# Patient Record
Sex: Female | Born: 1985 | Race: White | Hispanic: No | Marital: Married | Smoking: Never smoker
Health system: Southern US, Community
[De-identification: ages and names within clinical notes are randomized; demographics above are authoritative.]

---

## 2011-10-30 ENCOUNTER — Emergency Department (HOSPITAL_COMMUNITY)
Admission: EM | Admit: 2011-10-30 | Discharge: 2011-10-31 | Disposition: A | Discharge status: Home / Self Care | Payer: Self-pay | Attending: Emergency Medicine | Admitting: Emergency Medicine

## 2011-10-30 ENCOUNTER — Encounter (HOSPITAL_COMMUNITY): Payer: Self-pay | Admitting: Emergency Medicine

## 2011-10-30 DIAGNOSIS — N390 Urinary tract infection, site not specified: Secondary | ICD-10-CM | POA: Insufficient documentation

## 2011-10-30 LAB — POCT PREGNANCY, URINE: Preg Test, Ur: NEGATIVE

## 2011-10-30 LAB — URINALYSIS, ROUTINE W REFLEX MICROSCOPIC
Bilirubin Urine: NEGATIVE
Nitrite: NEGATIVE
Specific Gravity, Urine: 1.015 (ref 1.005–1.030)
pH: 7 (ref 5.0–8.0)

## 2011-10-30 LAB — URINE MICROSCOPIC-ADD ON

## 2011-10-30 NOTE — ED Notes (Signed)
PT. REPORTS URINARY FREQUENCY / BLADDER PRESSURE FOR SEVERAL DAYS .

## 2011-10-31 MED ORDER — PHENAZOPYRIDINE HCL 100 MG PO TABS
200.0000 mg | ORAL_TABLET | Freq: Once | ORAL | Status: AC
  Administered 2011-10-31: 200 mg via ORAL
  Filled 2011-10-31 (×2): qty 1

## 2011-10-31 MED ORDER — SULFAMETHOXAZOLE-TMP DS 800-160 MG PO TABS
1.0000 | ORAL_TABLET | Freq: Once | ORAL | Status: AC
  Administered 2011-10-31: 1 via ORAL
  Filled 2011-10-31: qty 1

## 2011-10-31 MED ORDER — SULFAMETHOXAZOLE-TMP DS 800-160 MG PO TABS: 1.0000 | ORAL_TABLET | Freq: Two times a day (BID) | ORAL | Status: AC

## 2011-10-31 NOTE — ED Provider Notes (Signed)
Medical screening examination/treatment/procedure(s) were performed by non-physician practitioner and as supervising physician I was immediately available for consultation/collaboration.  Yalda Herd, MD 10/31/11 0128 

## 2011-10-31 NOTE — ED Provider Notes (Signed)
History     CSN: [id number]  Arrival date & time 10/30/11  2250   First MD Initiated Contact with Patient 10/31/11 0016      Chief Complaint  Patient presents with  . Urinary Tract Infection    (Consider location/radiation/quality/duration/timing/severity/associated sxs/prior treatment) HPI Comments: Patient states she's had urinary frequency, and bladder pressure.  The past 2-3, days.  Denies any hematuria, fever, back pain, vaginal discharge  She states she has been frequent UTIs, but this is typical discomfort for her.    Patient is a 26 y.o. female presenting with urinary tract infection. The history is provided by the patient.  Urinary Tract Infection This is a new problem. The current episode started yesterday. The problem has been unchanged. Associated symptoms include urinary symptoms. Pertinent negatives include no abdominal pain, chills, fever, headaches, nausea, vomiting or weakness.    History reviewed. No pertinent past medical history.  History reviewed. No pertinent past surgical history.  No family history on file.  History  Substance Use Topics  . Smoking status: Never Smoker   . Smokeless tobacco: Not on file  . Alcohol Use: Yes    OB History    Grav Para Term Preterm Abortions TAB SAB Ect Mult Living                  Review of Systems  Constitutional: Negative for fever and chills.  Gastrointestinal: Negative for nausea, vomiting, abdominal pain and diarrhea.  Genitourinary: Positive for dysuria and frequency. Negative for hematuria and vaginal discharge.  Neurological: Negative for dizziness, weakness and headaches.    Allergies  Penicillins  Home Medications   Current Outpatient Rx  Name Route Sig Dispense Refill  . OVER THE COUNTER MEDICATION Oral Take 1 tablet by mouth daily. Over the Counter Diet Pills    . SULFAMETHOXAZOLE-TMP DS 800-160 MG PO TABS Oral Take 1 tablet by mouth 2 (two) times daily. 5 tablet 0    BP 110/60  Pulse 88   Temp 97.8 F (36.6 C) (Oral)  Resp 14  SpO2 100%  LMP 10/19/2011  Physical Exam  Constitutional: She appears well-developed and well-nourished.  HENT:  Head: Normocephalic.  Eyes: Pupils are equal, round, and reactive to light.  Neck: Normal range of motion.  Cardiovascular: Normal rate.   Pulmonary/Chest: Effort normal.  Abdominal: Soft. Bowel sounds are normal. She exhibits no distension. There is no tenderness.  Genitourinary: Vagina normal.  Musculoskeletal: Normal range of motion.  Neurological: She is alert.  Skin: Skin is warm.    ED Course  Procedures (including critical care time)  Labs Reviewed  URINALYSIS, ROUTINE W REFLEX MICROSCOPIC - Abnormal; Notable for the following:    APPearance CLOUDY (*)     Hgb urine dipstick MODERATE (*)     Ketones, ur >80 (*)     Leukocytes, UA MODERATE (*)     All other components within normal limits  URINE MICROSCOPIC-ADD ON - Abnormal; Notable for the following:    Squamous Epithelial / LPF FEW (*)     All other components within normal limits  POCT PREGNANCY, URINE   No results found.   1. UTI (lower urinary tract infection)       MDM   Urine results reviewed.  Discussed with patient treatment options.  She is requesting an inexpensive antibiotic.  Due to financial concerns       Arman Filter, NP 10/31/11 0111  Arman Filter, NP 10/31/11 0111

## 2011-10-31 NOTE — ED Notes (Signed)
Pt c/o bladder pressure, urinary frequency and urgency for two days.  Today states he  urine was pink.  NOt on menstrualscycle.  LMP 7/26.  Denies actual pain at this time.  No  History of same

## 2013-03-01 ENCOUNTER — Emergency Department (HOSPITAL_COMMUNITY)
Admission: EM | Admit: 2013-03-01 | Discharge: 2013-03-01 | Disposition: A | Discharge status: Home / Self Care | Payer: Medicaid Other | Attending: Emergency Medicine | Admitting: Emergency Medicine

## 2013-03-01 ENCOUNTER — Encounter (HOSPITAL_COMMUNITY): Payer: Self-pay | Admitting: Emergency Medicine

## 2013-03-01 DIAGNOSIS — Z3202 Encounter for pregnancy test, result negative: Secondary | ICD-10-CM | POA: Insufficient documentation

## 2013-03-01 DIAGNOSIS — R110 Nausea: Secondary | ICD-10-CM | POA: Insufficient documentation

## 2013-03-01 DIAGNOSIS — Z880 Allergy status to penicillin: Secondary | ICD-10-CM | POA: Insufficient documentation

## 2013-03-01 DIAGNOSIS — R197 Diarrhea, unspecified: Secondary | ICD-10-CM | POA: Insufficient documentation

## 2013-03-01 DIAGNOSIS — R109 Unspecified abdominal pain: Secondary | ICD-10-CM | POA: Insufficient documentation

## 2013-03-01 DIAGNOSIS — R5381 Other malaise: Secondary | ICD-10-CM | POA: Insufficient documentation

## 2013-03-01 LAB — URINALYSIS, ROUTINE W REFLEX MICROSCOPIC
Bilirubin Urine: NEGATIVE
Glucose, UA: NEGATIVE mg/dL
Ketones, ur: NEGATIVE mg/dL
Leukocytes, UA: NEGATIVE
Protein, ur: NEGATIVE mg/dL

## 2013-03-01 MED ORDER — ONDANSETRON HCL 4 MG/2ML IJ SOLN
4.0000 mg | Freq: Once | INTRAMUSCULAR | Status: AC
  Administered 2013-03-01: 4 mg via INTRAVENOUS
  Filled 2013-03-01: qty 2

## 2013-03-01 MED ORDER — PROMETHAZINE HCL 25 MG PO TABS: 25.0000 mg | ORAL_TABLET | Freq: Four times a day (QID) | ORAL | Status: DC | PRN

## 2013-03-01 MED ORDER — SODIUM CHLORIDE 0.9 % IV BOLUS (SEPSIS)
1000.0000 mL | Freq: Once | INTRAVENOUS | Status: AC
  Administered 2013-03-01: 1000 mL via INTRAVENOUS

## 2013-03-01 NOTE — ED Provider Notes (Signed)
CSN: [id number]     Arrival date & time 03/01/13  1739 History   First MD Initiated Contact with Patient 03/01/13 1803     Chief Complaint  Patient presents with  . Diarrhea   (Consider location/radiation/quality/duration/timing/severity/associated sxs/prior Treatment) Patient is a 27 y.o. female presenting with diarrhea. The history is provided by the patient.  Diarrhea Associated symptoms: abdominal pain   Associated symptoms: no headaches and no vomiting    patient's had diarrhea and nausea since Thursday. Her daughter had similar symptoms. She states she's had some mild chills. She's had abdominal cramping. No vomiting. She feels somewhat fatigued. No blood in stool. She did have black stool, but states she thinks it was from her Pepto-Bismol. She stopped taking that and the stool change color.  History reviewed. No pertinent past medical history. History reviewed. No pertinent past surgical history. History reviewed. No pertinent family history. History  Substance Use Topics  . Smoking status: Never Smoker   . Smokeless tobacco: Not on file  . Alcohol Use: Yes   OB History   Grav Para Term Preterm Abortions TAB SAB Ect Mult Living                 Review of Systems  Constitutional: Positive for appetite change and fatigue. Negative for activity change.  Eyes: Negative for pain.  Respiratory: Negative for chest tightness and shortness of breath.   Cardiovascular: Negative for chest pain and leg swelling.  Gastrointestinal: Positive for nausea, abdominal pain and diarrhea. Negative for vomiting.  Genitourinary: Negative for flank pain.  Musculoskeletal: Negative for back pain and neck stiffness.  Skin: Negative for rash.  Neurological: Negative for weakness, numbness and headaches.  Psychiatric/Behavioral: Negative for behavioral problems.    Allergies  Penicillins  Home Medications   Current Outpatient Rx  Name  Route  Sig  Dispense  Refill  . promethazine  (PHENERGAN) 25 MG tablet   Oral   Take 1 tablet (25 mg total) by mouth every 6 (six) hours as needed for nausea.   10 tablet   0    BP 106/70  Pulse 84  Temp(Src) 98 F (36.7 C) (Oral)  Resp 20  SpO2 100%  LMP 01/28/2013 Physical Exam  Nursing note and vitals reviewed. Constitutional: She is oriented to person, place, and time. She appears well-developed and well-nourished.  HENT:  Head: Normocephalic and atraumatic.  Eyes: EOM are normal. Pupils are equal, round, and reactive to light.  Neck: Normal range of motion. Neck supple.  Cardiovascular: Normal rate, regular rhythm and normal heart sounds.   No murmur heard. Pulmonary/Chest: Effort normal and breath sounds normal. No respiratory distress. She has no wheezes. She has no rales.  Abdominal: Soft. Bowel sounds are normal. She exhibits no distension. There is no tenderness. There is no rebound and no guarding.  Musculoskeletal: Normal range of motion.  Neurological: She is alert and oriented to person, place, and time. No cranial nerve deficit.  Skin: Skin is warm and dry.  Psychiatric: She has a normal mood and affect. Her speech is normal.    ED Course  Procedures (including critical care time) Labs Review Labs Reviewed  URINALYSIS, ROUTINE W REFLEX MICROSCOPIC  PREGNANCY, URINE   Imaging Review No results found.  EKG Interpretation   None       MDM   1. Diarrhea    Patient with nausea and diarrhea. Urine reassuring. Patient not pregnant. Patient feels better as tolerated orals after IV fluid. She be discharged  home    Juliet Rude. Rubin Payor, MD 03/01/13 2001

## 2013-03-01 NOTE — ED Notes (Signed)
Pt reports having diarrhea since Thursday, nausea and abd pain. Denies fever/chills. No acute distress noted at triage.

## 2013-03-26 NOTE — L&D Delivery Note (Signed)
[facility]  Labor Events:   Preterm labor:   Rupture date:   Rupture time:   Rupture type: Spontaneous  Fluid Color: Clear  Induction:   Augmentation:   Complications:   Cervical ripening:          Delivery:   Episiotomy: No  Lacerations: Second degree laceration   Repair suture: 3.0 vicryl, monocryl  Repair # of packets: 2  Blood loss (ml): 200   Information for the patient's newborn:  Dorna BloomFuquay, Boy Kris [[id number]][[id number]]    Delivery 03/10/2014 4:50 PM by  VBAC, Spontaneous Sex:  female Gestational Age: 9128w2d Delivery Clinician:  Benjamin StainMckenzie L Shakesha Soltau Living?: Yes        APGARS  One minute Five minutes Ten minutes  Skin color: 1   1      Heart rate: 2   2      Grimace: 2   2      Muscle tone: 2   2      Breathing: 2   2      Totals: 9  9      Presentation/position: Vertex  Right Occiput Anterior Resuscitation: None  Cord information:    Disposition of cord blood:     Blood gases sent?  Complications:   Placenta: Delivered: 03/10/2014 4:59 PM  Spontaneous  Intact appearance Newborn Measurements: Weight:    Height:    Head circumference:    Chest circumference:    Other providers: Delivery Nurse Attending Physician Halford DecampLindsay Stowe [facility] Kristy Rocio Acosta  Additional  information: Forceps:   Vacuum:   Breech:   Observed anomalies

## 2013-07-08 ENCOUNTER — Encounter (HOSPITAL_COMMUNITY): Payer: Self-pay | Admitting: Emergency Medicine

## 2013-07-08 ENCOUNTER — Emergency Department (HOSPITAL_COMMUNITY)
Admission: EM | Admit: 2013-07-08 | Discharge: 2013-07-08 | Disposition: A | Discharge status: Home / Self Care | Payer: Medicaid Other | Attending: Emergency Medicine | Admitting: Emergency Medicine

## 2013-07-08 DIAGNOSIS — O219 Vomiting of pregnancy, unspecified: Secondary | ICD-10-CM | POA: Diagnosis present

## 2013-07-08 DIAGNOSIS — R110 Nausea: Secondary | ICD-10-CM | POA: Diagnosis not present

## 2013-07-08 DIAGNOSIS — R112 Nausea with vomiting, unspecified: Secondary | ICD-10-CM

## 2013-07-08 DIAGNOSIS — O9989 Other specified diseases and conditions complicating pregnancy, childbirth and the puerperium: Secondary | ICD-10-CM | POA: Insufficient documentation

## 2013-07-08 DIAGNOSIS — Z3490 Encounter for supervision of normal pregnancy, unspecified, unspecified trimester: Secondary | ICD-10-CM

## 2013-07-08 DIAGNOSIS — R638 Other symptoms and signs concerning food and fluid intake: Secondary | ICD-10-CM | POA: Insufficient documentation

## 2013-07-08 DIAGNOSIS — Z880 Allergy status to penicillin: Secondary | ICD-10-CM | POA: Insufficient documentation

## 2013-07-08 LAB — URINALYSIS, ROUTINE W REFLEX MICROSCOPIC
BILIRUBIN URINE: NEGATIVE
Glucose, UA: NEGATIVE mg/dL
Hgb urine dipstick: NEGATIVE
KETONES UR: NEGATIVE mg/dL
Leukocytes, UA: NEGATIVE
NITRITE: NEGATIVE
Protein, ur: NEGATIVE mg/dL
SPECIFIC GRAVITY, URINE: 1.015 (ref 1.005–1.030)
UROBILINOGEN UA: 1 mg/dL (ref 0.0–1.0)
pH: 7.5 (ref 5.0–8.0)

## 2013-07-08 LAB — POC URINE PREG, ED: PREG TEST UR: POSITIVE — AB

## 2013-07-08 MED ORDER — ONDANSETRON 4 MG PO TBDP: ORAL_TABLET | ORAL | Status: DC

## 2013-07-08 NOTE — Progress Notes (Signed)
[facility] provided pt with a list of primary care resources and a [facility] [employer] application to help patient establish primary care.  °

## 2013-07-08 NOTE — Discharge Instructions (Signed)
If you were given medicines take as directed.  If you are on coumadin or contraceptives realize their levels and effectiveness is altered by many different medicines.  If you have any reaction (rash, tongues swelling, other) to the medicines stop taking and see a physician.   Please follow up as directed and return to the ER or see a physician for new or worsening symptoms.  Thank you. Filed Vitals:   07/08/13 0947  BP: 111/77  Pulse: 77  Temp: 97 F (36.1 C)  TempSrc: Axillary  Resp: 18  SpO2: 100%

## 2013-07-08 NOTE — ED Notes (Signed)
Pt c/o NV x 2 days.  Denies pain.  LMP 06/01/13.

## 2013-07-08 NOTE — ED Provider Notes (Signed)
CSN: [id number]     Arrival date & time 07/08/13  [id number] History   First MD Initiated Contact with Patient 07/08/13 [phone]     Chief Complaint  Patient presents with  . Nausea  . Emesis     (Consider location/radiation/quality/duration/timing/severity/associated sxs/prior Treatment) HPI Comments: 28 yo female with pregnancy/ vomiting with pregnancy hx presents with nausea for the past three days.  No abd pain or diarrhea, pt feels well otherwise. Last menstrual period late. Pt is sexually active, no bleeding or discharge.  Intermittent sxs.   Patient is a 28 y.o. female presenting with vomiting. The history is provided by the patient.  Emesis Associated symptoms: no abdominal pain, no chills, no diarrhea and no headaches     History reviewed. No pertinent past medical history. No past surgical history on file. No family history on file. History  Substance Use Topics  . Smoking status: Never Smoker   . Smokeless tobacco: Not on file  . Alcohol Use: Yes   OB History   Grav Para Term Preterm Abortions TAB SAB Ect Mult Living                 Review of Systems  Constitutional: Positive for appetite change. Negative for fever and chills.  HENT: Negative for congestion.   Eyes: Negative for visual disturbance.  Respiratory: Negative for shortness of breath.   Cardiovascular: Negative for chest pain.  Gastrointestinal: Positive for nausea and vomiting. Negative for abdominal pain and diarrhea.  Genitourinary: Negative for dysuria and flank pain.  Musculoskeletal: Negative for back pain, neck pain and neck stiffness.  Skin: Negative for rash.  Neurological: Negative for light-headedness and headaches.      Allergies  Penicillins  Home Medications   Prior to Admission medications   Medication Sig Start Date End Date Taking? Authorizing Provider  cetirizine (ZYRTEC) 10 MG tablet Take 10 mg by mouth daily as needed for allergies.   Yes Historical Provider, MD  ondansetron  (ZOFRAN ODT) 4 MG disintegrating tablet 4mg  ODT q4 hours prn nausea/vomit 07/08/13   Enid SkeensJoshua M Chiana Wamser, MD   BP 111/77  Pulse 77  Temp(Src) 97 F (36.1 C) (Axillary)  Resp 18  SpO2 100%  LMP 06/01/2013 Physical Exam  Nursing note and vitals reviewed. Constitutional: She is oriented to person, place, and time. She appears well-developed and well-nourished.  HENT:  Head: Normocephalic and atraumatic.  Eyes: Conjunctivae are normal. Right eye exhibits no discharge. Left eye exhibits no discharge.  Neck: Normal range of motion. Neck supple. No tracheal deviation present.  Cardiovascular: Normal rate and regular rhythm.   Pulmonary/Chest: Effort normal and breath sounds normal.  Abdominal: Soft. She exhibits no distension. There is no tenderness. There is no guarding.  Musculoskeletal: She exhibits no edema.  Neurological: She is alert and oriented to person, place, and time.  Skin: Skin is warm. No rash noted.  Psychiatric: She has a normal mood and affect.    ED Course  Procedures (including critical care time) EMERGENCY DEPARTMENT US PREGNANCY "Study: Limited Ultrasound of the Pelvis for Pregnancy"  INDICATIONS:Pregnancy(required) nausea Multiple views of the uterus and pelvic cavity were obtained in real-time with a multi-frequency probe.  APPROACH:Transabdominal   PERFORMED BY: Myself  IMAGES ARCHIVED?: Yes  LIMITATIONS: Body habitus  PREGNANCY FREE FLUID: None  PREGNANCY FINDINGS: Intrauterine gestational sac noted  INTERPRETATION: gest sac shows [redacted] wk gestation, no FH tones seen; likely early preg, cannot rule out abnormal preg      Labs Review Labs  Reviewed  POC URINE PREG, ED - Abnormal; Notable for the following:    Preg Test, Ur POSITIVE (*)    All other components within normal limits  URINALYSIS, ROUTINE W REFLEX MICROSCOPIC    Imaging Review No results found.   EKG Interpretation None      MDM   Final diagnoses:  Pregnancy  Nausea & vomiting     Well appearing. Nausea main sxs. New pregnancy, no pain or bleeding, no hx of ectopic. Bedside US gest sac intrauterine, no free fluid, unable to see FHR either due to too early or needs transvaginal. No indication for emergent transvaginal US, fup with OB discussed. Results and differential diagnosis were discussed with the patient. Close follow up outpatient was discussed, patient comfortable with the plan.   Filed Vitals:   07/08/13 0947  BP: 111/77  Pulse: 77  Temp: 97 F (36.1 C)  TempSrc: Axillary  Resp: 18  SpO2: 100%           Enid SkeensJoshua M Syann Cupples, MD 07/08/13 1038

## 2013-07-14 ENCOUNTER — Encounter: Payer: Self-pay | Admitting: Advanced Practice Midwife

## 2013-07-29 ENCOUNTER — Encounter: Payer: Self-pay | Admitting: Advanced Practice Midwife

## 2013-08-05 ENCOUNTER — Encounter: Payer: Self-pay | Admitting: Advanced Practice Midwife

## 2013-08-05 ENCOUNTER — Encounter: Payer: Self-pay | Admitting: *Deleted

## 2013-08-05 ENCOUNTER — Ambulatory Visit (INDEPENDENT_AMBULATORY_CARE_PROVIDER_SITE_OTHER): Payer: Self-pay | Admitting: Advanced Practice Midwife

## 2013-08-05 VITALS — BP 108/72 | HR 94 | Temp 97.2°F | Ht 64.0 in | Wt 189.5 lb

## 2013-08-05 DIAGNOSIS — O34219 Maternal care for unspecified type scar from previous cesarean delivery: Secondary | ICD-10-CM | POA: Insufficient documentation

## 2013-08-05 LAB — POCT URINALYSIS DIP (DEVICE)
Bilirubin Urine: NEGATIVE
Glucose, UA: NEGATIVE mg/dL
Hgb urine dipstick: NEGATIVE
Ketones, ur: NEGATIVE mg/dL
LEUKOCYTES UA: NEGATIVE
NITRITE: NEGATIVE
PH: 7.5 (ref 5.0–8.0)
PROTEIN: NEGATIVE mg/dL
Specific Gravity, Urine: 1.015 (ref 1.005–1.030)
Urobilinogen, UA: 1 mg/dL (ref 0.0–1.0)

## 2013-08-05 NOTE — Progress Notes (Signed)
C/o of left hip pain.  New OB labs today. Early 1hr gtt. Pap with cultures.  Discussed appropriate weight gain (11-20lb); pt. Verbalized understanding.  New OB packet given.

## 2013-08-05 NOTE — Progress Notes (Signed)
New OB.  See SmartSet  Subjective:    Doris Mays is a G2P1001 5683w2d being seen today for her first obstetrical visit.  Her obstetrical history is significant for obesity and previous C/S. Patient does intend to breast feed. Pregnancy history fully reviewed.  Patient reports no complaints.  Filed Vitals:   08/05/13 1106 08/05/13 1110  BP: 108/72   Pulse: 94   Temp: 97.2 F (36.2 C)   Height:  5\' 4"  (1.626 m)  Weight: 189 lb 8 oz (85.957 kg)     HISTORY: OB History  Gravida Para Term Preterm AB SAB TAB Ectopic Multiple Living  2 1 1  0 0 0 0 0 0 1    # Outcome Date GA Lbr Len/2nd Weight Sex Delivery Anes PTL Lv  2 CUR           1 TRM 08/01/05 2991w0d   F CS   Y     Comments: baby breech      History reviewed. No pertinent past medical history. Past Surgical History  Procedure Laterality Date  . Cesarean section     History reviewed. No pertinent family history.   Exam    Uterus:  Fundal Height: 9 cm  Pelvic Exam:    Perineum: No Hemorrhoids, Normal Perineum   Vulva: Bartholin's, Urethra, Skene's normal   Vagina:  normal discharge   pH:    Cervix: no cervical motion tenderness   Adnexa: no mass, fullness, tenderness   Bony Pelvis: gynecoid  System: Breast:  normal appearance, no masses or tenderness   Skin: normal coloration and turgor, no rashes    Neurologic: oriented, grossly non-focal   Extremities: normal strength, tone, and muscle mass   HEENT neck supple with midline trachea   Mouth/Teeth mucous membranes moist, pharynx normal without lesions   Neck supple and no masses   Cardiovascular: regular rate and rhythm, no murmurs or gallops   Respiratory:  appears well, vitals normal, no respiratory distress, acyanotic, normal RR, ear and throat exam is normal, neck Mays of mass or lymphadenopathy, chest clear, no wheezing, crepitations, rhonchi, normal symmetric air entry   Abdomen: soft, non-tender; bowel sounds normal; no masses,  no organomegaly   Urinary:  urethral meatus normal   Unable to doppler FHTs   Assessment:    Pregnancy: G2P1001 Patient Active Problem List   Diagnosis Date Noted  . Previous cesarean delivery affecting pregnancy, antepartum 08/05/2013        Plan:     Initial labs drawn. Prenatal vitamins. Problem list reviewed and updated. Genetic Screening discussed First Screen: declined.  Ultrasound discussed; fetal survey: Ordered for dating and viability.  Follow up in 4 weeks. 50% of 30 min visit spent on counseling and coordination of care.     Aviva SignsMarie L Williams 08/05/2013

## 2013-08-05 NOTE — Patient Instructions (Signed)
Pregnancy - First Trimester  During sexual intercourse, millions of sperm go into the vagina. Only 1 sperm will penetrate and fertilize the female egg while it is in the Fallopian tube. One week later, the fertilized egg implants into the wall of the uterus. An embryo begins to develop into a baby. At 6 to 8 weeks, the eyes and face are formed and the heartbeat can be seen on ultrasound. At the end of 12 weeks (first trimester), all the baby's organs are formed. Now that you are pregnant, you will want to do everything you can to have a healthy baby. Two of the most important things are to get good prenatal care and follow your caregiver's instructions. Prenatal care is all the medical care you receive before the baby's birth. It is given to prevent, find, and treat problems during the pregnancy and childbirth.  PRENATAL EXAMS  · During prenatal visits, your weight, blood pressure, and urine are checked. This is done to make sure you are healthy and progressing normally during the pregnancy.  · A pregnant woman should gain 25 to 35 pounds during the pregnancy. However, if you are overweight or underweight, your caregiver will advise you regarding your weight.  · Your caregiver will ask and answer questions for you.  · Blood work, cervical cultures, other necessary tests, and a Pap test are done during your prenatal exams. These tests are done to check on your health and the probable health of your baby. Tests are strongly recommended and done for HIV with your permission. This is the virus that causes AIDS. These tests are done because medicines can be given to help prevent your baby from being born with this infection should you have been infected without knowing it. Blood work is also used to find out your blood type, previous infections, and follow your blood levels (hemoglobin).  · Low hemoglobin (anemia) is common during pregnancy. Iron and vitamins are given to help prevent this. Later in the pregnancy, blood  tests for diabetes will be done along with any other tests if any problems develop.  · You may need other tests to make sure you and the baby are doing well.  CHANGES DURING THE FIRST TRIMESTER   Your body goes through many changes during pregnancy. They vary from person to person. Talk to your caregiver about changes you notice and are concerned about. Changes can include:  · Your menstrual period stops.  · The egg and sperm carry the genes that determine what you look like. Genes from you and your partner are forming a baby. The female genes determine whether the baby is a boy or a girl.  · Your body increases in girth and you may feel bloated.  · Feeling sick to your stomach (nauseous) and throwing up (vomiting). If the vomiting is uncontrollable, call your caregiver.  · Your breasts will begin to enlarge and become tender.  · Your nipples may stick out more and become darker.  · The need to urinate more. Painful urination may mean you have a bladder infection.  · Tiring easily.  · Loss of appetite.  · Cravings for certain kinds of food.  · At first, you may gain or lose a couple of pounds.  · You may have changes in your emotions from day to day (excited to be pregnant or concerned something may go wrong with the pregnancy and baby).  · You may have more vivid and strange dreams.  HOME CARE INSTRUCTIONS   ·   It is very important to avoid all smoking, alcohol and non-prescribed drugs during your pregnancy. These affect the formation and growth of the baby. Avoid chemicals while pregnant to ensure the delivery of a healthy infant.  · Start your prenatal visits by the 12th week of pregnancy. They are usually scheduled monthly at first, then more often in the last 2 months before delivery. Keep your caregiver's appointments. Follow your caregiver's instructions regarding medicine use, blood and lab tests, exercise, and diet.  · During pregnancy, you are providing food for you and your baby. Eat regular, well-balanced  meals. Choose foods such as meat, fish, milk and other low fat dairy products, vegetables, fruits, and whole-grain breads and cereals. Your caregiver will tell you of the ideal weight gain.  · You can help morning sickness by keeping soda crackers at the bedside. Eat a couple before arising in the morning. You may want to use the crackers without salt on them.  · Eating 4 to 5 small meals rather than 3 large meals a day also may help the nausea and vomiting.  · Drinking liquids between meals instead of during meals also seems to help nausea and vomiting.  · A physical sexual relationship may be continued throughout pregnancy if there are no other problems. Problems may be early (premature) leaking of amniotic fluid from the membranes, vaginal bleeding, or belly (abdominal) pain.  · Exercise regularly if there are no restrictions. Check with your caregiver or physical therapist if you are unsure of the safety of some of your exercises. Greater weight gain will occur in the last 2 trimesters of pregnancy. Exercising will help:  · Control your weight.  · Keep you in shape.  · Prepare you for labor and delivery.  · Help you lose your pregnancy weight after you deliver your baby.  · Wear a good support or jogging bra for breast tenderness during pregnancy. This may help if worn during sleep too.  · Ask when prenatal classes are available. Begin classes when they are offered.  · Do not use hot tubs, steam rooms, or saunas.  · Wear your seat belt when driving. This protects you and your baby if you are in an accident.  · Avoid raw meat, uncooked cheese, cat litter boxes, and soil used by cats throughout the pregnancy. These carry germs that can cause birth defects in the baby.  · The first trimester is a good time to visit your dentist for your dental health. Getting your teeth cleaned is okay. Use a softer toothbrush and brush gently during pregnancy.  · Ask for help if you have financial, counseling, or nutritional needs  during pregnancy. Your caregiver will be able to offer counseling for these needs as well as refer you for other special needs.  · Do not take any medicines or herbs unless told by your caregiver.  · Inform your caregiver if there is any mental or physical domestic violence.  · Make a list of emergency phone numbers of family, friends, hospital, and police and fire departments.  · Write down your questions. Take them to your prenatal visit.  · Do not douche.  · Do not cross your legs.  · If you have to stand for long periods of time, rotate you feet or take small steps in a circle.  · You may have more vaginal secretions that may require a sanitary pad. Do not use tampons or scented sanitary pads.  MEDICINES AND DRUG USE IN PREGNANCY  ·   Take prenatal vitamins as directed. The vitamin should contain 1 milligram of folic acid. Keep all vitamins out of reach of children. Only a couple vitamins or tablets containing iron may be fatal to a baby or young child when ingested.  · Avoid use of all medicines, including herbs, over-the-counter medicines, not prescribed or suggested by your caregiver. Only take over-the-counter or prescription medicines for pain, discomfort, or fever as directed by your caregiver. Do not use aspirin, ibuprofen, or naproxen unless directed by your caregiver.  · Let your caregiver also know about herbs you may be using.  · Alcohol is related to a number of birth defects. This includes fetal alcohol syndrome. All alcohol, in any form, should be avoided completely. Smoking will cause low birth rate and premature babies.  · Street or illegal drugs are very harmful to the baby. They are absolutely forbidden. A baby born to an addicted mother will be addicted at birth. The baby will go through the same withdrawal an adult does.  · Let your caregiver know about any medicines that you have to take and for what reason you take them.  SEEK MEDICAL CARE IF:   You have any concerns or worries during your  pregnancy. It is better to call with your questions if you feel they cannot wait, rather than worry about them.  SEEK IMMEDIATE MEDICAL CARE IF:   · An unexplained oral temperature above 102° F (38.9° C) develops, or as your caregiver suggests.  · You have leaking of fluid from the vagina (birth canal). If leaking membranes are suspected, take your temperature and inform your caregiver of this when you call.  · There is vaginal spotting or bleeding. Notify your caregiver of the amount and how many pads are used.  · You develop a bad smelling vaginal discharge with a change in the color.  · You continue to feel sick to your stomach (nauseated) and have no relief from remedies suggested. You vomit blood or coffee ground-like materials.  · You lose more than 2 pounds of weight in 1 week.  · You gain more than 2 pounds of weight in 1 week and you notice swelling of your face, hands, feet, or legs.  · You gain 5 pounds or more in 1 week (even if you do not have swelling of your hands, face, legs, or feet).  · You get exposed to German measles and have never had them.  · You are exposed to fifth disease or chickenpox.  · You develop belly (abdominal) pain. Round ligament discomfort is a common non-cancerous (benign) cause of abdominal pain in pregnancy. Your caregiver still must evaluate this.  · You develop headache, fever, diarrhea, pain with urination, or shortness of breath.  · You fall or are in a car accident or have any kind of trauma.  · There is mental or physical violence in your home.  Document Released: 03/06/2001 Document Revised: 12/05/2011 Document Reviewed: 09/07/2008  ExitCare® Patient Information ©2014 [employer].

## 2013-08-06 ENCOUNTER — Encounter: Payer: Self-pay | Admitting: Advanced Practice Midwife

## 2013-08-06 LAB — OBSTETRIC PANEL
ANTIBODY SCREEN: NEGATIVE
BASOS ABS: 0 10*3/uL (ref 0.0–0.1)
BASOS PCT: 0 % (ref 0–1)
Eosinophils Absolute: 0.1 10*3/uL (ref 0.0–0.7)
Eosinophils Relative: 1 % (ref 0–5)
HCT: 37.3 % (ref 36.0–46.0)
HEP B S AG: NEGATIVE
Hemoglobin: 12.6 g/dL (ref 12.0–15.0)
Lymphocytes Relative: 28 % (ref 12–46)
Lymphs Abs: 1.8 10*3/uL (ref 0.7–4.0)
MCH: 29.2 pg (ref 26.0–34.0)
MCHC: 33.8 g/dL (ref 30.0–36.0)
MCV: 86.3 fL (ref 78.0–100.0)
MONO ABS: 0.5 10*3/uL (ref 0.1–1.0)
Monocytes Relative: 8 % (ref 3–12)
NEUTROS ABS: 4.1 10*3/uL (ref 1.7–7.7)
NEUTROS PCT: 63 % (ref 43–77)
Platelets: 301 10*3/uL (ref 150–400)
RBC: 4.32 MIL/uL (ref 3.87–5.11)
RDW: 13.7 % (ref 11.5–15.5)
Rh Type: POSITIVE
Rubella: 1.54 Index — ABNORMAL HIGH (ref <0.90)
WBC: 6.5 10*3/uL (ref 4.0–10.5)

## 2013-08-06 LAB — GLUCOSE TOLERANCE, 1 HOUR (50G) W/O FASTING: Glucose, 1 Hour GTT: 98 mg/dL (ref 70–140)

## 2013-08-06 LAB — HIV ANTIBODY (ROUTINE TESTING W REFLEX): HIV: NONREACTIVE

## 2013-08-07 LAB — PRESCRIPTION MONITORING PROFILE (19 PANEL)
AMPHETAMINE/METH: NEGATIVE ng/mL
BUPRENORPHINE, URINE: NEGATIVE ng/mL
Barbiturate Screen, Urine: NEGATIVE ng/mL
Benzodiazepine Screen, Urine: NEGATIVE ng/mL
CARISOPRODOL, URINE: NEGATIVE ng/mL
Cannabinoid Scrn, Ur: NEGATIVE ng/mL
Cocaine Metabolites: NEGATIVE ng/mL
Creatinine, Urine: 74.83 mg/dL (ref >20.0)
Fentanyl, Ur: NEGATIVE ng/mL
MDMA URINE: NEGATIVE ng/mL
METHADONE SCREEN, URINE: NEGATIVE ng/mL
METHAQUALONE SCREEN (URINE): NEGATIVE ng/mL
Meperidine, Ur: NEGATIVE ng/mL
NITRITES URINE, INITIAL: NEGATIVE ug/mL
Opiate Screen, Urine: NEGATIVE ng/mL
Oxycodone Screen, Ur: NEGATIVE ng/mL
PROPOXYPHENE: NEGATIVE ng/mL
Phencyclidine, Ur: NEGATIVE ng/mL
TAPENTADOLUR: NEGATIVE ng/mL
TRAMADOL UR: NEGATIVE ng/mL
Zolpidem, Urine: NEGATIVE ng/mL
pH, Initial: 7.7 pH (ref 4.5–8.9)

## 2013-08-07 LAB — CULTURE, OB URINE
COLONY COUNT: NO GROWTH
ORGANISM ID, BACTERIA: NO GROWTH

## 2013-08-11 ENCOUNTER — Other Ambulatory Visit: Payer: Self-pay | Admitting: Advanced Practice Midwife

## 2013-08-11 ENCOUNTER — Ambulatory Visit (HOSPITAL_COMMUNITY)
Admission: RE | Admit: 2013-08-11 | Discharge: 2013-08-11 | Disposition: A | Discharge status: Home / Self Care | Payer: Medicaid Other | Source: Ambulatory Visit | Attending: Advanced Practice Midwife | Admitting: Advanced Practice Midwife

## 2013-08-11 DIAGNOSIS — O99210 Obesity complicating pregnancy, unspecified trimester: Secondary | ICD-10-CM

## 2013-08-11 DIAGNOSIS — Z3689 Encounter for other specified antenatal screening: Secondary | ICD-10-CM | POA: Diagnosis not present

## 2013-08-11 DIAGNOSIS — O34219 Maternal care for unspecified type scar from previous cesarean delivery: Secondary | ICD-10-CM | POA: Insufficient documentation

## 2013-08-11 DIAGNOSIS — E669 Obesity, unspecified: Secondary | ICD-10-CM | POA: Diagnosis not present

## 2013-08-17 ENCOUNTER — Other Ambulatory Visit: Payer: Self-pay | Admitting: Advanced Practice Midwife

## 2013-08-17 DIAGNOSIS — O34219 Maternal care for unspecified type scar from previous cesarean delivery: Secondary | ICD-10-CM

## 2013-10-14 ENCOUNTER — Ambulatory Visit (INDEPENDENT_AMBULATORY_CARE_PROVIDER_SITE_OTHER): Payer: Medicaid Other | Admitting: Advanced Practice Midwife

## 2013-10-14 ENCOUNTER — Encounter: Payer: Self-pay | Admitting: Advanced Practice Midwife

## 2013-10-14 VITALS — BP 113/66 | HR 99 | Temp 98.0°F | Wt 198.0 lb

## 2013-10-14 DIAGNOSIS — R12 Heartburn: Secondary | ICD-10-CM

## 2013-10-14 DIAGNOSIS — O26892 Other specified pregnancy related conditions, second trimester: Secondary | ICD-10-CM

## 2013-10-14 DIAGNOSIS — O34219 Maternal care for unspecified type scar from previous cesarean delivery: Secondary | ICD-10-CM

## 2013-10-14 DIAGNOSIS — Z3480 Encounter for supervision of other normal pregnancy, unspecified trimester: Secondary | ICD-10-CM

## 2013-10-14 DIAGNOSIS — O9989 Other specified diseases and conditions complicating pregnancy, childbirth and the puerperium: Secondary | ICD-10-CM

## 2013-10-14 DIAGNOSIS — Z3492 Encounter for supervision of normal pregnancy, unspecified, second trimester: Secondary | ICD-10-CM

## 2013-10-14 LAB — POCT URINALYSIS DIP (DEVICE)
BILIRUBIN URINE: NEGATIVE
Glucose, UA: NEGATIVE mg/dL
Hgb urine dipstick: NEGATIVE
Ketones, ur: NEGATIVE mg/dL
LEUKOCYTES UA: NEGATIVE
NITRITE: NEGATIVE
PH: 6 (ref 5.0–8.0)
PROTEIN: NEGATIVE mg/dL
Specific Gravity, Urine: 1.02 (ref 1.005–1.030)
UROBILINOGEN UA: 0.2 mg/dL (ref 0.0–1.0)

## 2013-10-14 MED ORDER — FAMOTIDINE 10 MG PO TABS: 20.0000 mg | ORAL_TABLET | Freq: Two times a day (BID) | ORAL | Status: DC

## 2013-10-14 NOTE — Progress Notes (Signed)
Patient reports round ligament pain and pain around tailbone

## 2013-10-14 NOTE — Patient Instructions (Addendum)
Ibuprofen 600 mg (tablets) every 6 hours as needed. Do not use after 28 weeks.   Benadryl, Claritin, Zyrtec   Second Trimester of Pregnancy The second trimester is from week 13 through week 28, months 4 through 6. The second trimester is often a time when you feel your best. Your body has also adjusted to being pregnant, and you begin to feel better physically. Usually, morning sickness has lessened or quit completely, you may have more energy, and you may have an increase in appetite. The second trimester is also a time when the fetus is growing rapidly. At the end of the sixth month, the fetus is about 9 inches long and weighs about 1 pounds. You will likely begin to feel the baby move (quickening) between 18 and 20 weeks of the pregnancy. BODY CHANGES Your body goes through many changes during pregnancy. The changes vary from woman to woman.   Your weight will continue to increase. You will notice your lower abdomen bulging out.  You may begin to get stretch marks on your hips, abdomen, and breasts.  You may develop headaches that can be relieved by medicines approved by your health care provider.  You may urinate more often because the fetus is pressing on your bladder.  You may develop or continue to have heartburn as a result of your pregnancy.  You may develop constipation because certain hormones are causing the muscles that push waste through your intestines to slow down.  You may develop hemorrhoids or swollen, bulging veins (varicose veins).  You may have back pain because of the weight gain and pregnancy hormones relaxing your joints between the bones in your pelvis and as a result of a shift in weight and the muscles that support your balance.  Your breasts will continue to grow and be tender.  Your gums may bleed and may be sensitive to brushing and flossing.  Dark spots or blotches (chloasma, mask of pregnancy) may develop on your face. This will likely fade after the  baby is born.  A dark line from your belly button to the pubic area (linea nigra) may appear. This will likely fade after the baby is born.  You may have changes in your hair. These can include thickening of your hair, rapid growth, and changes in texture. Some women also have hair loss during or after pregnancy, or hair that feels dry or thin. Your hair will most likely return to normal after your baby is born. WHAT TO EXPECT AT YOUR PRENATAL VISITS During a routine prenatal visit:  You will be weighed to make sure you and the fetus are growing normally.  Your blood pressure will be taken.  Your abdomen will be measured to track your baby's growth.  The fetal heartbeat will be listened to.  Any test results from the previous visit will be discussed. Your health care provider may ask you:  How you are feeling.  If you are feeling the baby move.  If you have had any abnormal symptoms, such as leaking fluid, bleeding, severe headaches, or abdominal cramping.  If you have any questions. Other tests that may be performed during your second trimester include:  Blood tests that check for:  Low iron levels (anemia).  Gestational diabetes (between 24 and 28 weeks).  Rh antibodies.  Urine tests to check for infections, diabetes, or protein in the urine.  An ultrasound to confirm the proper growth and development of the baby.  An amniocentesis to check for possible genetic  problems.  Fetal screens for spina bifida and Down syndrome. HOME CARE INSTRUCTIONS   Avoid all smoking, herbs, alcohol, and unprescribed drugs. These chemicals affect the formation and growth of the baby.  Follow your health care provider's instructions regarding medicine use. There are medicines that are either safe or unsafe to take during pregnancy.  Exercise only as directed by your health care provider. Experiencing uterine cramps is a good sign to stop exercising.  Continue to eat regular, healthy  meals.  Wear a good support bra for breast tenderness.  Do not use hot tubs, steam rooms, or saunas.  Wear your seat belt at all times when driving.  Avoid raw meat, uncooked cheese, cat litter boxes, and soil used by cats. These carry germs that can cause birth defects in the baby.  Take your prenatal vitamins.  Try taking a stool softener (if your health care provider approves) if you develop constipation. Eat more high-fiber foods, such as fresh vegetables or fruit and whole grains. Drink plenty of fluids to keep your urine clear or pale yellow.  Take warm sitz baths to soothe any pain or discomfort caused by hemorrhoids. Use hemorrhoid cream if your health care provider approves.  If you develop varicose veins, wear support hose. Elevate your feet for 15 minutes, 3-4 times a day. Limit salt in your diet.  Avoid heavy lifting, wear low heel shoes, and practice good posture.  Rest with your legs elevated if you have leg cramps or low back pain.  Visit your dentist if you have not gone yet during your pregnancy. Use a soft toothbrush to brush your teeth and be gentle when you floss.  A sexual relationship may be continued unless your health care provider directs you otherwise.  Continue to go to all your prenatal visits as directed by your health care provider. SEEK MEDICAL CARE IF:   You have dizziness.  You have mild pelvic cramps, pelvic pressure, or nagging pain in the abdominal area.  You have persistent nausea, vomiting, or diarrhea.  You have a bad smelling vaginal discharge.  You have pain with urination. SEEK IMMEDIATE MEDICAL CARE IF:   You have a fever.  You are leaking fluid from your vagina.  You have spotting or bleeding from your vagina.  You have severe abdominal cramping or pain.  You have rapid weight gain or loss.  You have shortness of breath with chest pain.  You notice sudden or extreme swelling of your face, hands, ankles, feet, or  legs.  You have not felt your baby move in over an hour.  You have severe headaches that do not go away with medicine.  You have vision changes. Document Released: 03/06/2001 Document Revised: 03/17/2013 Document Reviewed: 05/13/2012 [facility] Patient Information 2015 [place], [place]. This information is not intended to replace advice given to you by your health care provider. Make sure you discuss any questions you have with your health care provider.

## 2013-10-14 NOTE — Progress Notes (Signed)
Heartburn, LBP. Comfort measures discussed. Anatomy scan ordered. Quad screen.

## 2013-10-15 ENCOUNTER — Telehealth: Payer: Self-pay

## 2013-10-15 LAB — AFP TUMOR MARKER: AFP-Tumor Marker: 41 ng/mL — ABNORMAL HIGH (ref 0.0–8.0)

## 2013-10-15 NOTE — Telephone Encounter (Signed)
Patient called stating she had had some cramping a day ago and her work wants to make sure she has no restrictions-- stated something about having letter faxed but could not understand as message was breaking up. Patient states she can be reached at work [phone].   Called patient who states she is pretty sure she does not have any restriction but her work wants a Physicist, medicalletter confirming that she has no work restrictions. Informed patient that at this time she does not have any restrictions and that I would fax a letter to her employer stating that is so. Letter faxed to [phone].

## 2013-10-20 ENCOUNTER — Ambulatory Visit (HOSPITAL_COMMUNITY)
Admission: RE | Admit: 2013-10-20 | Discharge: 2013-10-20 | Disposition: A | Discharge status: Home / Self Care | Payer: Medicaid Other | Source: Ambulatory Visit | Attending: Advanced Practice Midwife | Admitting: Advanced Practice Midwife

## 2013-10-20 DIAGNOSIS — Z3689 Encounter for other specified antenatal screening: Secondary | ICD-10-CM | POA: Insufficient documentation

## 2013-10-20 DIAGNOSIS — Z3492 Encounter for supervision of normal pregnancy, unspecified, second trimester: Secondary | ICD-10-CM

## 2013-10-23 ENCOUNTER — Encounter: Payer: Self-pay | Admitting: Advanced Practice Midwife

## 2013-10-26 ENCOUNTER — Telehealth: Payer: Self-pay | Admitting: *Deleted

## 2013-10-26 NOTE — Telephone Encounter (Signed)
Message copied by Dorothyann PengHAIZLIP, Amal Renbarger E on Mon Oct 26, 2013  8:25 AM ------      Message from: Dorathy KinsmanSMITH, [place]      Created: Fri Oct 23, 2013  6:33 PM       Was wrong one drawn? Can the correct one be calculated from this result? ------

## 2013-10-26 NOTE — Telephone Encounter (Signed)
Contacted patient at work, pt is able to come for AFP lab draw on Wednesday August 5,2015 @ 0800.

## 2013-10-28 ENCOUNTER — Other Ambulatory Visit: Payer: Medicaid Other

## 2013-10-28 DIAGNOSIS — Z3492 Encounter for supervision of normal pregnancy, unspecified, second trimester: Secondary | ICD-10-CM

## 2013-10-28 DIAGNOSIS — O34219 Maternal care for unspecified type scar from previous cesarean delivery: Secondary | ICD-10-CM

## 2013-10-29 LAB — ALPHA FETOPROTEIN, MATERNAL
AFP: 44.4 IU/mL
Curr Gest Age: 22.3 wks.days
MoM for AFP: 0.76
OPEN SPINA BIFIDA: NEGATIVE

## 2013-11-11 ENCOUNTER — Encounter: Payer: Medicaid Other | Admitting: Physician Assistant

## 2013-11-11 ENCOUNTER — Encounter: Payer: Self-pay | Admitting: Physician Assistant

## 2013-11-13 ENCOUNTER — Encounter: Payer: Self-pay | Admitting: General Practice

## 2013-11-23 ENCOUNTER — Ambulatory Visit (INDEPENDENT_AMBULATORY_CARE_PROVIDER_SITE_OTHER): Payer: Medicaid Other | Admitting: Advanced Practice Midwife

## 2013-11-23 ENCOUNTER — Encounter: Payer: Self-pay | Admitting: Advanced Practice Midwife

## 2013-11-23 VITALS — BP 109/59 | HR 82 | Temp 98.2°F | Wt 206.8 lb

## 2013-11-23 DIAGNOSIS — O34219 Maternal care for unspecified type scar from previous cesarean delivery: Secondary | ICD-10-CM

## 2013-11-23 LAB — POCT URINALYSIS DIP (DEVICE)
Bilirubin Urine: NEGATIVE
Glucose, UA: NEGATIVE mg/dL
Hgb urine dipstick: NEGATIVE
KETONES UR: NEGATIVE mg/dL
Leukocytes, UA: NEGATIVE
Nitrite: NEGATIVE
PH: 6.5 (ref 5.0–8.0)
PROTEIN: NEGATIVE mg/dL
SPECIFIC GRAVITY, URINE: 1.02 (ref 1.005–1.030)
Urobilinogen, UA: 4 mg/dL — ABNORMAL HIGH (ref 0.0–1.0)

## 2013-11-23 NOTE — Progress Notes (Signed)
Reviewed normal anatomy US. Placenta posterior. VBAC consent given today >> signed  Plans TOLAC. Glucola next visit

## 2013-11-23 NOTE — Patient Instructions (Signed)
Second Trimester of Pregnancy The second trimester is from week 13 through week 28, months 4 through 6. The second trimester is often a time when you feel your best. Your body has also adjusted to being pregnant, and you begin to feel better physically. Usually, morning sickness has lessened or quit completely, you may have more energy, and you may have an increase in appetite. The second trimester is also a time when the fetus is growing rapidly. At the end of the sixth month, the fetus is about 9 inches long and weighs about 1 pounds. You will likely begin to feel the baby move (quickening) between 18 and 20 weeks of the pregnancy. BODY CHANGES Your body goes through many changes during pregnancy. The changes vary from woman to woman.   Your weight will continue to increase. You will notice your lower abdomen bulging out.  You may begin to get stretch marks on your hips, abdomen, and breasts.  You may develop headaches that can be relieved by medicines approved by your health care provider.  You may urinate more often because the fetus is pressing on your bladder.  You may develop or continue to have heartburn as a result of your pregnancy.  You may develop constipation because certain hormones are causing the muscles that push waste through your intestines to slow down.  You may develop hemorrhoids or swollen, bulging veins (varicose veins).  You may have back pain because of the weight gain and pregnancy hormones relaxing your joints between the bones in your pelvis and as a result of a shift in weight and the muscles that support your balance.  Your breasts will continue to grow and be tender.  Your gums may bleed and may be sensitive to brushing and flossing.  Dark spots or blotches (chloasma, mask of pregnancy) may develop on your face. This will likely fade after the baby is born.  A dark line from your belly button to the pubic area (linea nigra) may appear. This will likely fade  after the baby is born.  You may have changes in your hair. These can include thickening of your hair, rapid growth, and changes in texture. Some women also have hair loss during or after pregnancy, or hair that feels dry or thin. Your hair will most likely return to normal after your baby is born. WHAT TO EXPECT AT YOUR PRENATAL VISITS During a routine prenatal visit:  You will be weighed to make sure you and the fetus are growing normally.  Your blood pressure will be taken.  Your abdomen will be measured to track your baby's growth.  The fetal heartbeat will be listened to.  Any test results from the previous visit will be discussed. Your health care provider may ask you:  How you are feeling.  If you are feeling the baby move.  If you have had any abnormal symptoms, such as leaking fluid, bleeding, severe headaches, or abdominal cramping.  If you have any questions. Other tests that may be performed during your second trimester include:  Blood tests that check for:  Low iron levels (anemia).  Gestational diabetes (between 24 and 28 weeks).  Rh antibodies.  Urine tests to check for infections, diabetes, or protein in the urine.  An ultrasound to confirm the proper growth and development of the baby.  An amniocentesis to check for possible genetic problems.  Fetal screens for spina bifida and Down syndrome. HOME CARE INSTRUCTIONS   Avoid all smoking, herbs, alcohol, and unprescribed   drugs. These chemicals affect the formation and growth of the baby.  Follow your health care provider's instructions regarding medicine use. There are medicines that are either safe or unsafe to take during pregnancy.  Exercise only as directed by your health care provider. Experiencing uterine cramps is a good sign to stop exercising.  Continue to eat regular, healthy meals.  Wear a good support bra for breast tenderness.  Do not use hot tubs, steam rooms, or saunas.  Wear your  seat belt at all times when driving.  Avoid raw meat, uncooked cheese, cat litter boxes, and soil used by cats. These carry germs that can cause birth defects in the baby.  Take your prenatal vitamins.  Try taking a stool softener (if your health care provider approves) if you develop constipation. Eat more high-fiber foods, such as fresh vegetables or fruit and whole grains. Drink plenty of fluids to keep your urine clear or pale yellow.  Take warm sitz baths to soothe any pain or discomfort caused by hemorrhoids. Use hemorrhoid cream if your health care provider approves.  If you develop varicose veins, wear support hose. Elevate your feet for 15 minutes, 3-4 times a day. Limit salt in your diet.  Avoid heavy lifting, wear low heel shoes, and practice good posture.  Rest with your legs elevated if you have leg cramps or low back pain.  Visit your dentist if you have not gone yet during your pregnancy. Use a soft toothbrush to brush your teeth and be gentle when you floss.  A sexual relationship may be continued unless your health care provider directs you otherwise.  Continue to go to all your prenatal visits as directed by your health care provider. SEEK MEDICAL CARE IF:   You have dizziness.  You have mild pelvic cramps, pelvic pressure, or nagging pain in the abdominal area.  You have persistent nausea, vomiting, or diarrhea.  You have a bad smelling vaginal discharge.  You have pain with urination. SEEK IMMEDIATE MEDICAL CARE IF:   You have a fever.  You are leaking fluid from your vagina.  You have spotting or bleeding from your vagina.  You have severe abdominal cramping or pain.  You have rapid weight gain or loss.  You have shortness of breath with chest pain.  You notice sudden or extreme swelling of your face, hands, ankles, feet, or legs.  You have not felt your baby move in over an hour.  You have severe headaches that do not go away with  medicine.  You have vision changes. Document Released: 03/06/2001 Document Revised: 03/17/2013 Document Reviewed: 05/13/2012 [employer] Patient Information 2015 [employer]. This information is not intended to replace advice given to you by your health care provider. Make sure you discuss any questions you have with your health care provider.  

## 2013-11-23 NOTE — Progress Notes (Signed)
No complaints today.

## 2013-11-25 ENCOUNTER — Encounter: Payer: Self-pay | Admitting: General Practice

## 2013-12-10 ENCOUNTER — Encounter (HOSPITAL_COMMUNITY): Payer: Self-pay | Admitting: Emergency Medicine

## 2013-12-10 ENCOUNTER — Emergency Department (HOSPITAL_COMMUNITY)
Admission: EM | Admit: 2013-12-10 | Discharge: 2013-12-11 | Disposition: A | Discharge status: Home / Self Care | Payer: Medicaid Other | Attending: Emergency Medicine | Admitting: Emergency Medicine

## 2013-12-10 DIAGNOSIS — Z3490 Encounter for supervision of normal pregnancy, unspecified, unspecified trimester: Secondary | ICD-10-CM

## 2013-12-10 DIAGNOSIS — R5383 Other fatigue: Secondary | ICD-10-CM | POA: Diagnosis not present

## 2013-12-10 DIAGNOSIS — Z880 Allergy status to penicillin: Secondary | ICD-10-CM | POA: Diagnosis not present

## 2013-12-10 DIAGNOSIS — D649 Anemia, unspecified: Secondary | ICD-10-CM | POA: Insufficient documentation

## 2013-12-10 DIAGNOSIS — O99019 Anemia complicating pregnancy, unspecified trimester: Secondary | ICD-10-CM | POA: Diagnosis not present

## 2013-12-10 DIAGNOSIS — R002 Palpitations: Secondary | ICD-10-CM

## 2013-12-10 DIAGNOSIS — R5381 Other malaise: Secondary | ICD-10-CM | POA: Diagnosis not present

## 2013-12-10 DIAGNOSIS — O9989 Other specified diseases and conditions complicating pregnancy, childbirth and the puerperium: Secondary | ICD-10-CM | POA: Insufficient documentation

## 2013-12-10 LAB — URINE MICROSCOPIC-ADD ON

## 2013-12-10 LAB — CBC WITH DIFFERENTIAL/PLATELET
Basophils Absolute: 0 10*3/uL (ref 0.0–0.1)
Basophils Relative: 0 % (ref 0–1)
EOS PCT: 1 % (ref 0–5)
Eosinophils Absolute: 0.1 10*3/uL (ref 0.0–0.7)
HCT: 32.7 % — ABNORMAL LOW (ref 36.0–46.0)
HEMOGLOBIN: 11 g/dL — AB (ref 12.0–15.0)
LYMPHS PCT: 17 % (ref 12–46)
Lymphs Abs: 1.5 10*3/uL (ref 0.7–4.0)
MCH: 29.1 pg (ref 26.0–34.0)
MCHC: 33.6 g/dL (ref 30.0–36.0)
MCV: 86.5 fL (ref 78.0–100.0)
MONO ABS: 0.8 10*3/uL (ref 0.1–1.0)
MONOS PCT: 9 % (ref 3–12)
Neutro Abs: 6.7 10*3/uL (ref 1.7–7.7)
Neutrophils Relative %: 73 % (ref 43–77)
Platelets: 248 10*3/uL (ref 150–400)
RBC: 3.78 MIL/uL — ABNORMAL LOW (ref 3.87–5.11)
RDW: 13.5 % (ref 11.5–15.5)
WBC: 9.1 10*3/uL (ref 4.0–10.5)

## 2013-12-10 LAB — BASIC METABOLIC PANEL
Anion gap: 10 (ref 5–15)
BUN: 7 mg/dL (ref 6–23)
CALCIUM: 9.1 mg/dL (ref 8.4–10.5)
CO2: 23 mEq/L (ref 19–32)
CREATININE: 0.46 mg/dL — AB (ref 0.50–1.10)
Chloride: 104 mEq/L (ref 96–112)
GFR calc Af Amer: >90 mL/min (ref >90)
GFR calc non Af Amer: >90 mL/min (ref >90)
Glucose, Bld: 89 mg/dL (ref 70–99)
Potassium: 3.7 mEq/L (ref 3.7–5.3)
Sodium: 137 mEq/L (ref 137–147)

## 2013-12-10 LAB — URINALYSIS, ROUTINE W REFLEX MICROSCOPIC
BILIRUBIN URINE: NEGATIVE
Glucose, UA: NEGATIVE mg/dL
HGB URINE DIPSTICK: NEGATIVE
KETONES UR: NEGATIVE mg/dL
Nitrite: NEGATIVE
PH: 7.5 (ref 5.0–8.0)
Protein, ur: NEGATIVE mg/dL
Specific Gravity, Urine: 1.008 (ref 1.005–1.030)
Urobilinogen, UA: 1 mg/dL (ref 0.0–1.0)

## 2013-12-10 MED ORDER — SODIUM CHLORIDE 0.9 % IV BOLUS (SEPSIS)
1000.0000 mL | Freq: Once | INTRAVENOUS | Status: AC
  Administered 2013-12-10: 1000 mL via INTRAVENOUS

## 2013-12-10 NOTE — ED Notes (Signed)
Went in to move pt to lobby and pt is vomiting  Pt states she has been feeling nauseated after eating but has not had vomiting until now

## 2013-12-10 NOTE — ED Notes (Signed)
Pt states she is [redacted] weeks pregnant and is not having complications with the pregnancy but for the past week to week and a half she has just been feeling bad  Pt states when she doesn't eat she feels gittery and like her heart beats too fast  Pt states she feels exhausted the rest of the time   Pt states she has not notified her OB/GYN of this

## 2013-12-10 NOTE — Discharge Instructions (Signed)
Return to the emergency room with worsening of symptoms, new symptoms or with symptoms that are concerning, especially abdominal cramping/pain, vaginal discharge, vaginal bleeding, chest pain and shortness of breath. Follow up with OBGYN on Monday September 21. Take prenatal vitamins daily. Add OTC iron supplement daily. Comment side effects include constipation and abdominal pain. If this occurs try taking the pills every other day.

## 2013-12-10 NOTE — ED Notes (Signed)
Ambulated pt in hall with pulse Ox. Pt's O2 stayed at 98%, HR 105. PA made aware.

## 2013-12-10 NOTE — ED Provider Notes (Signed)
CSN: [id number]     Arrival date & time 12/10/13  1835 History   First MD Initiated Contact with Patient 12/10/13 2044     Chief Complaint  Patient presents with  . Palpitations     (Consider location/radiation/quality/duration/timing/severity/associated sxs/prior Treatment) HPI Doris Mays is a 28 y.o. female , pregnant, 28 weeks presenting with one and a half weeks of fatigue and feeling jittery if she doesn't eat and some times after she eats something sugary and feels like her heart is beating too fast after activity. Some intermittent shortness of breath with activity. No chest pain. Patient denies recent surgery, trauma, history of DVT, PE. No current leg swelling or pain. No hemoptysis. Patient denies lower abdominal cramping, vaginal discharge, vaginal bleeding. She is followed with [facility] for her OB/GYN care. Her previous ultrasound was normal. And she is taking prenatal vitamins. She denies dysuria, foul-smelling urine.   History reviewed. No pertinent past medical history. Past Surgical History  Procedure Laterality Date  . Cesarean section     Family History  Problem Relation Age of Onset  . Diabetes Father   . Hypertension Other    History  Substance Use Topics  . Smoking status: Never Smoker   . Smokeless tobacco: Never Used  . Alcohol Use: No   OB History   Grav Para Term Preterm Abortions TAB SAB Ect Mult Living   0 0 0 0 0 0 1     Review of Systems  Constitutional: Negative for fever and chills.  HENT: Negative for congestion and rhinorrhea.   Eyes: Negative for visual disturbance.  Respiratory: Negative for cough and shortness of breath.   Cardiovascular: Positive for palpitations. Negative for chest pain.  Gastrointestinal: Negative for nausea, vomiting and diarrhea.  Genitourinary: Negative for dysuria and hematuria.  Musculoskeletal: Negative for back pain and gait problem.  Skin: Negative for rash.  Neurological: Negative for weakness  and headaches.      Allergies  Penicillins  Home Medications   Prior to Admission medications   Medication Sig Start Date End Date Taking? Authorizing Provider  Pediatric Multiple Vit-C-FA (PEDIATRIC MULTIVITAMIN) chewable tablet Chew 1 tablet by mouth daily.   Yes Historical Provider, MD   BP 106/66  Pulse 92  Temp(Src) 98.6 F (37 C) (Oral)  Resp 16  SpO2 100%  LMP 06/01/2013 Physical Exam  Nursing note and vitals reviewed. Constitutional: She appears well-developed and well-nourished. No distress.  HENT:  Head: Normocephalic and atraumatic.  Mouth/Throat: Oropharynx is clear and moist.  Eyes: Conjunctivae and EOM are normal. Right eye exhibits no discharge. Left eye exhibits no discharge.  Cardiovascular: Normal rate, regular rhythm and normal heart sounds.   Pulmonary/Chest: Effort normal and breath sounds normal. No respiratory distress. She has no wheezes.  Abdominal: Soft. Bowel sounds are normal. She exhibits no distension. There is no tenderness.  Musculoskeletal:  No leg swelling or tenderness  Neurological: She is alert. She exhibits normal muscle tone. Coordination normal.  Strength 5/5 in upper and lower extremities. Sensation intact. Normal gait.   Skin: Skin is warm and dry. She is not diaphoretic.    ED Course  Procedures (including critical care time) Labs Review Labs Reviewed  CBC WITH DIFFERENTIAL - Abnormal; Notable for the following:    RBC 3.78 (*)    Hemoglobin 11.0 (*)    HCT 32.7 (*)    All other components within normal limits  BASIC METABOLIC PANEL - Abnormal; Notable for the following:  Creatinine, Ser 0.46 (*)    All other components within normal limits  URINALYSIS, ROUTINE W REFLEX MICROSCOPIC - Abnormal; Notable for the following:    Leukocytes, UA TRACE (*)    All other components within normal limits  URINE MICROSCOPIC-ADD ON - Abnormal; Notable for the following:    Bacteria, UA FEW (*)    All other components within normal  limits    Imaging Review No results found.   EKG Interpretation   Date/Time:  Thursday December 10 2013 19:27:28 EDT Ventricular Rate:  92 PR Interval:  139 QRS Duration: 90 QT Interval:  382 QTC Calculation: 473 R Axis:   -5 Text Interpretation:  Sinus rhythm RSR' in V1 or V2, right VCD or RVH U  waves present Confirmed by PICKERING  MD, NATHAN [phone]) on 12/10/2013  7:32:58 PM      MDM   Final diagnoses:  Pregnancy  Other fatigue  Palpitations  Mild anemia   Patient presenting with general fatigue with vague unwellness with sensation of palpitations and intermittent shortness of breath. Patient is [redacted] weeks pregnant. VSS without tachycardia or O2 stat below 99% at rest. Patient with unremarkable neuro exam.  Labs with HGB 11 and UA without infection. Patient has mild anemia.  Patient is pregnant with intermittent SOB but no h/o DVT, PE, hemoptysis or unilateral leg swelling. No chest pain. Patient ambulated with O2 stat 98% and HR 105. SOB could be due to pregnancy or anemia. Discussed the possibility of PE with the patient and the potential testing. Her clinical picture is not consistent and patient did not want further testing at this time. All return precautions provided. Patient to follow up with OBGYN on Monday 9/21. Patient to take prenatal vitamins more regularly and initiate OTC iron supplements.   Discussed return precautions with patient. Discussed all results and patient verbalizes understanding and agrees with plan.  Case has been discussed with Dr. Rubin Payor who agrees with the above plan to discharge.     Louann Sjogren, PA-C 12/11/13 [phone]

## 2013-12-12 NOTE — ED Provider Notes (Signed)
Medical screening examination/treatment/procedure(s) were performed by non-physician practitioner and as supervising physician I was immediately available for consultation/collaboration.   EKG Interpretation   Date/Time:  Thursday December 10 2013 19:27:28 EDT Ventricular Rate:  92 PR Interval:  139 QRS Duration: 90 QT Interval:  382 QTC Calculation: 473 R Axis:   -5 Text Interpretation:  Sinus rhythm RSR' in V1 or V2, right VCD or RVH U  waves present Confirmed by Rubin Payor  MD, Lilliann Rossetti [phone]) on 12/10/2013  7:32:58 PM       Juliet Rude. Rubin Payor, MD 12/12/13 [phone]

## 2013-12-14 ENCOUNTER — Ambulatory Visit (INDEPENDENT_AMBULATORY_CARE_PROVIDER_SITE_OTHER): Payer: Medicaid Other | Admitting: Obstetrics and Gynecology

## 2013-12-14 ENCOUNTER — Encounter: Payer: Self-pay | Admitting: Obstetrics and Gynecology

## 2013-12-14 VITALS — BP 121/70 | HR 94 | Temp 97.6°F | Wt 209.5 lb

## 2013-12-14 DIAGNOSIS — Z23 Encounter for immunization: Secondary | ICD-10-CM

## 2013-12-14 DIAGNOSIS — O34219 Maternal care for unspecified type scar from previous cesarean delivery: Secondary | ICD-10-CM

## 2013-12-14 LAB — POCT URINALYSIS DIP (DEVICE)
BILIRUBIN URINE: NEGATIVE
GLUCOSE, UA: NEGATIVE mg/dL
Hgb urine dipstick: NEGATIVE
KETONES UR: NEGATIVE mg/dL
LEUKOCYTES UA: NEGATIVE
Nitrite: NEGATIVE
PROTEIN: NEGATIVE mg/dL
Specific Gravity, Urine: 1.02 (ref 1.005–1.030)
Urobilinogen, UA: 0.2 mg/dL (ref 0.0–1.0)
pH: 7 (ref 5.0–8.0)

## 2013-12-14 MED ORDER — TETANUS-DIPHTH-ACELL PERTUSSIS 5-2.5-18.5 LF-MCG/0.5 IM SUSP: 0.5000 mL | Freq: Once | INTRAMUSCULAR | Status: DC

## 2013-12-14 NOTE — Progress Notes (Signed)
Fatigue, no further palpitations. Denies SOB. Seen in ED for same 9/17> hgb 11, EKG normal. 28 wk labs and tDap today.  Primary C/S [facility]. Breech, no labor.  She asks if she had single or double layer closure. Will get op note and review with pt next.

## 2013-12-14 NOTE — Progress Notes (Signed)
Pt c/o feeling lots of fatigue Went to [facility] for palpitations on 12/06/13 Tdap consented and info given Declined flu vaccine

## 2013-12-15 LAB — RPR

## 2013-12-15 LAB — CBC
HCT: 33.8 % — ABNORMAL LOW (ref 36.0–46.0)
Hemoglobin: 11.3 g/dL — ABNORMAL LOW (ref 12.0–15.0)
MCH: 29.1 pg (ref 26.0–34.0)
MCHC: 33.4 g/dL (ref 30.0–36.0)
MCV: 87.1 fL (ref 78.0–100.0)
PLATELETS: 252 10*3/uL (ref 150–400)
RBC: 3.88 MIL/uL (ref 3.87–5.11)
RDW: 13.9 % (ref 11.5–15.5)
WBC: 10.7 10*3/uL — AB (ref 4.0–10.5)

## 2013-12-15 LAB — GLUCOSE TOLERANCE, 1 HOUR (50G) W/O FASTING: GLUCOSE 1 HOUR GTT: 103 mg/dL (ref 70–140)

## 2013-12-15 LAB — HIV ANTIBODY (ROUTINE TESTING W REFLEX): HIV: NONREACTIVE

## 2013-12-30 ENCOUNTER — Ambulatory Visit (INDEPENDENT_AMBULATORY_CARE_PROVIDER_SITE_OTHER): Payer: Medicaid Other | Admitting: Physician Assistant

## 2013-12-30 VITALS — BP 102/69 | HR 96 | Temp 97.7°F | Wt 212.6 lb

## 2013-12-30 DIAGNOSIS — O3421 Maternal care for scar from previous cesarean delivery: Secondary | ICD-10-CM

## 2013-12-30 DIAGNOSIS — Z23 Encounter for immunization: Secondary | ICD-10-CM

## 2013-12-30 DIAGNOSIS — O34219 Maternal care for unspecified type scar from previous cesarean delivery: Secondary | ICD-10-CM

## 2013-12-30 LAB — POCT URINALYSIS DIP (DEVICE)
BILIRUBIN URINE: NEGATIVE
GLUCOSE, UA: NEGATIVE mg/dL
Hgb urine dipstick: NEGATIVE
Ketones, ur: NEGATIVE mg/dL
Nitrite: NEGATIVE
Protein, ur: NEGATIVE mg/dL
SPECIFIC GRAVITY, URINE: 1.015 (ref 1.005–1.030)
UROBILINOGEN UA: 0.2 mg/dL (ref 0.0–1.0)
pH: 6.5 (ref 5.0–8.0)

## 2013-12-30 NOTE — Patient Instructions (Signed)
Third Trimester of Pregnancy The third trimester is from week 29 through week 42, months 7 through 9. The third trimester is a time when the fetus is growing rapidly. At the end of the ninth month, the fetus is about 20 inches in length and weighs 6-10 pounds.  BODY CHANGES Your body goes through many changes during pregnancy. The changes vary from woman to woman.   Your weight will continue to increase. You can expect to gain 25-35 pounds (11-16 kg) by the end of the pregnancy.  You may begin to get stretch marks on your hips, abdomen, and breasts.  You may urinate more often because the fetus is moving lower into your pelvis and pressing on your bladder.  You may develop or continue to have heartburn as a result of your pregnancy.  You may develop constipation because certain hormones are causing the muscles that push waste through your intestines to slow down.  You may develop hemorrhoids or swollen, bulging veins (varicose veins).  You may have pelvic pain because of the weight gain and pregnancy hormones relaxing your joints between the bones in your pelvis. Backaches may result from overexertion of the muscles supporting your posture.  You may have changes in your hair. These can include thickening of your hair, rapid growth, and changes in texture. Some women also have hair loss during or after pregnancy, or hair that feels dry or thin. Your hair will most likely return to normal after your baby is born.  Your breasts will continue to grow and be tender. A yellow discharge may leak from your breasts called colostrum.  Your belly button may stick out.  You may feel short of breath because of your expanding uterus.  You may notice the fetus "dropping," or moving lower in your abdomen.  You may have a bloody mucus discharge. This usually occurs a few days to a week before labor begins.  Your cervix becomes thin and soft (effaced) near your due date. WHAT TO EXPECT AT YOUR PRENATAL  EXAMS  You will have prenatal exams every 2 weeks until week 36. Then, you will have weekly prenatal exams. During a routine prenatal visit:  You will be weighed to make sure you and the fetus are growing normally.  Your blood pressure is taken.  Your abdomen will be measured to track your baby's growth.  The fetal heartbeat will be listened to.  Any test results from the previous visit will be discussed.  You may have a cervical check near your due date to see if you have effaced. At around 36 weeks, your caregiver will check your cervix. At the same time, your caregiver will also perform a test on the secretions of the vaginal tissue. This test is to determine if a type of bacteria, Group B streptococcus, is present. Your caregiver will explain this further. Your caregiver may ask you:  What your birth plan is.  How you are feeling.  If you are feeling the baby move.  If you have had any abnormal symptoms, such as leaking fluid, bleeding, severe headaches, or abdominal cramping.  If you have any questions. Other tests or screenings that may be performed during your third trimester include:  Blood tests that check for low iron levels (anemia).  Fetal testing to check the health, activity level, and growth of the fetus. Testing is done if you have certain medical conditions or if there are problems during the pregnancy. FALSE LABOR You may feel small, irregular contractions that   eventually go away. These are called Braxton Hicks contractions, or false labor. Contractions may last for hours, days, or even weeks before true labor sets in. If contractions come at regular intervals, intensify, or become painful, it is best to be seen by your caregiver.  SIGNS OF LABOR   Menstrual-like cramps.  Contractions that are 5 minutes apart or less.  Contractions that start on the top of the uterus and spread down to the lower abdomen and back.  A sense of increased pelvic pressure or back  pain.  A watery or bloody mucus discharge that comes from the vagina. If you have any of these signs before the 37th week of pregnancy, call your caregiver right away. You need to go to the hospital to get checked immediately. HOME CARE INSTRUCTIONS   Avoid all smoking, herbs, alcohol, and unprescribed drugs. These chemicals affect the formation and growth of the baby.  Follow your caregiver's instructions regarding medicine use. There are medicines that are either safe or unsafe to take during pregnancy.  Exercise only as directed by your caregiver. Experiencing uterine cramps is a good sign to stop exercising.  Continue to eat regular, healthy meals.  Wear a good support bra for breast tenderness.  Do not use hot tubs, steam rooms, or saunas.  Wear your seat belt at all times when driving.  Avoid raw meat, uncooked cheese, cat litter boxes, and soil used by cats. These carry germs that can cause birth defects in the baby.  Take your prenatal vitamins.  Try taking a stool softener (if your caregiver approves) if you develop constipation. Eat more high-fiber foods, such as fresh vegetables or fruit and whole grains. Drink plenty of fluids to keep your urine clear or pale yellow.  Take warm sitz baths to soothe any pain or discomfort caused by hemorrhoids. Use hemorrhoid cream if your caregiver approves.  If you develop varicose veins, wear support hose. Elevate your feet for 15 minutes, 3-4 times a day. Limit salt in your diet.  Avoid heavy lifting, wear low heal shoes, and practice good posture.  Rest a lot with your legs elevated if you have leg cramps or low back pain.  Visit your dentist if you have not gone during your pregnancy. Use a soft toothbrush to brush your teeth and be gentle when you floss.  A sexual relationship may be continued unless your caregiver directs you otherwise.  Do not travel far distances unless it is absolutely necessary and only with the approval  of your caregiver.  Take prenatal classes to understand, practice, and ask questions about the labor and delivery.  Make a trial run to the hospital.  Pack your hospital bag.  Prepare the baby's nursery.  Continue to go to all your prenatal visits as directed by your caregiver. SEEK MEDICAL CARE IF:  You are unsure if you are in labor or if your water has broken.  You have dizziness.  You have mild pelvic cramps, pelvic pressure, or nagging pain in your abdominal area.  You have persistent nausea, vomiting, or diarrhea.  You have a bad smelling vaginal discharge.  You have pain with urination. SEEK IMMEDIATE MEDICAL CARE IF:   You have a fever.  You are leaking fluid from your vagina.  You have spotting or bleeding from your vagina.  You have severe abdominal cramping or pain.  You have rapid weight loss or gain.  You have shortness of breath with chest pain.  You notice sudden or extreme swelling   of your face, hands, ankles, feet, or legs.  You have not felt your baby move in over an hour.  You have severe headaches that do not go away with medicine.  You have vision changes. Document Released: 03/06/2001 Document Revised: 03/17/2013 Document Reviewed: 05/13/2012 [employer] Patient Information 2015 [employer]. This information is not intended to replace advice given to you by your health care provider. Make sure you discuss any questions you have with your health care provider.  

## 2013-12-30 NOTE — Progress Notes (Signed)
30 weeks, complaining of L ear pain x 1 week.  Denies vag bleeding, LOF, dysuria, abdominal pain.  Endorses good fetal movement. L ear observed to be completely occluded by cerumen Use Debrox for ear wax removal.  Call if no improvement.   Await records for csection.  Anticipates VBAC.   RTC 2 weeks.

## 2013-12-30 NOTE — Progress Notes (Signed)
Declined flu vaccine. Pt is concerned about left ear pain

## 2014-01-06 ENCOUNTER — Encounter: Payer: Self-pay | Admitting: *Deleted

## 2014-01-13 ENCOUNTER — Encounter: Payer: Self-pay | Admitting: Advanced Practice Midwife

## 2014-01-13 ENCOUNTER — Ambulatory Visit (INDEPENDENT_AMBULATORY_CARE_PROVIDER_SITE_OTHER): Payer: Medicaid Other | Admitting: Advanced Practice Midwife

## 2014-01-13 VITALS — BP 107/57 | HR 126 | Temp 98.0°F | Wt 214.5 lb

## 2014-01-13 DIAGNOSIS — O34219 Maternal care for unspecified type scar from previous cesarean delivery: Secondary | ICD-10-CM

## 2014-01-13 DIAGNOSIS — O3421 Maternal care for scar from previous cesarean delivery: Secondary | ICD-10-CM

## 2014-01-13 LAB — POCT URINALYSIS DIP (DEVICE)
BILIRUBIN URINE: NEGATIVE
GLUCOSE, UA: NEGATIVE mg/dL
HGB URINE DIPSTICK: NEGATIVE
Ketones, ur: NEGATIVE mg/dL
Leukocytes, UA: NEGATIVE
NITRITE: NEGATIVE
Protein, ur: NEGATIVE mg/dL
Specific Gravity, Urine: 1.02 (ref 1.005–1.030)
Urobilinogen, UA: 0.2 mg/dL (ref 0.0–1.0)
pH: 7 (ref 5.0–8.0)

## 2014-01-13 NOTE — Patient Instructions (Signed)
Third Trimester of Pregnancy The third trimester is from week 29 through week 42, months 7 through 9. The third trimester is a time when the fetus is growing rapidly. At the end of the ninth month, the fetus is about 20 inches in length and weighs 6-10 pounds.  BODY CHANGES Your body goes through many changes during pregnancy. The changes vary from woman to woman.   Your weight will continue to increase. You can expect to gain 25-35 pounds (11-16 kg) by the end of the pregnancy.  You may begin to get stretch marks on your hips, abdomen, and breasts.  You may urinate more often because the fetus is moving lower into your pelvis and pressing on your bladder.  You may develop or continue to have heartburn as a result of your pregnancy.  You may develop constipation because certain hormones are causing the muscles that push waste through your intestines to slow down.  You may develop hemorrhoids or swollen, bulging veins (varicose veins).  You may have pelvic pain because of the weight gain and pregnancy hormones relaxing your joints between the bones in your pelvis. Backaches may result from overexertion of the muscles supporting your posture.  You may have changes in your hair. These can include thickening of your hair, rapid growth, and changes in texture. Some women also have hair loss during or after pregnancy, or hair that feels dry or thin. Your hair will most likely return to normal after your baby is born.  Your breasts will continue to grow and be tender. A yellow discharge may leak from your breasts called colostrum.  Your belly button may stick out.  You may feel short of breath because of your expanding uterus.  You may notice the fetus "dropping," or moving lower in your abdomen.  You may have a bloody mucus discharge. This usually occurs a few days to a week before labor begins.  Your cervix becomes thin and soft (effaced) near your due date. WHAT TO EXPECT AT YOUR PRENATAL  EXAMS  You will have prenatal exams every 2 weeks until week 36. Then, you will have weekly prenatal exams. During a routine prenatal visit:  You will be weighed to make sure you and the fetus are growing normally.  Your blood pressure is taken.  Your abdomen will be measured to track your baby's growth.  The fetal heartbeat will be listened to.  Any test results from the previous visit will be discussed.  You may have a cervical check near your due date to see if you have effaced. At around 36 weeks, your caregiver will check your cervix. At the same time, your caregiver will also perform a test on the secretions of the vaginal tissue. This test is to determine if a type of bacteria, Group B streptococcus, is present. Your caregiver will explain this further. Your caregiver may ask you:  What your birth plan is.  How you are feeling.  If you are feeling the baby move.  If you have had any abnormal symptoms, such as leaking fluid, bleeding, severe headaches, or abdominal cramping.  If you have any questions. Other tests or screenings that may be performed during your third trimester include:  Blood tests that check for low iron levels (anemia).  Fetal testing to check the health, activity level, and growth of the fetus. Testing is done if you have certain medical conditions or if there are problems during the pregnancy. FALSE LABOR You may feel small, irregular contractions that   eventually go away. These are called Braxton Hicks contractions, or false labor. Contractions may last for hours, days, or even weeks before true labor sets in. If contractions come at regular intervals, intensify, or become painful, it is best to be seen by your caregiver.  SIGNS OF LABOR   Menstrual-like cramps.  Contractions that are 5 minutes apart or less.  Contractions that start on the top of the uterus and spread down to the lower abdomen and back.  A sense of increased pelvic pressure or back  pain.  A watery or bloody mucus discharge that comes from the vagina. If you have any of these signs before the 37th week of pregnancy, call your caregiver right away. You need to go to the hospital to get checked immediately. HOME CARE INSTRUCTIONS   Avoid all smoking, herbs, alcohol, and unprescribed drugs. These chemicals affect the formation and growth of the baby.  Follow your caregiver's instructions regarding medicine use. There are medicines that are either safe or unsafe to take during pregnancy.  Exercise only as directed by your caregiver. Experiencing uterine cramps is a good sign to stop exercising.  Continue to eat regular, healthy meals.  Wear a good support bra for breast tenderness.  Do not use hot tubs, steam rooms, or saunas.  Wear your seat belt at all times when driving.  Avoid raw meat, uncooked cheese, cat litter boxes, and soil used by cats. These carry germs that can cause birth defects in the baby.  Take your prenatal vitamins.  Try taking a stool softener (if your caregiver approves) if you develop constipation. Eat more high-fiber foods, such as fresh vegetables or fruit and whole grains. Drink plenty of fluids to keep your urine clear or pale yellow.  Take warm sitz baths to soothe any pain or discomfort caused by hemorrhoids. Use hemorrhoid cream if your caregiver approves.  If you develop varicose veins, wear support hose. Elevate your feet for 15 minutes, 3-4 times a day. Limit salt in your diet.  Avoid heavy lifting, wear low heal shoes, and practice good posture.  Rest a lot with your legs elevated if you have leg cramps or low back pain.  Visit your dentist if you have not gone during your pregnancy. Use a soft toothbrush to brush your teeth and be gentle when you floss.  A sexual relationship may be continued unless your caregiver directs you otherwise.  Do not travel far distances unless it is absolutely necessary and only with the approval  of your caregiver.  Take prenatal classes to understand, practice, and ask questions about the labor and delivery.  Make a trial run to the hospital.  Pack your hospital bag.  Prepare the baby's nursery.  Continue to go to all your prenatal visits as directed by your caregiver. SEEK MEDICAL CARE IF:  You are unsure if you are in labor or if your water has broken.  You have dizziness.  You have mild pelvic cramps, pelvic pressure, or nagging pain in your abdominal area.  You have persistent nausea, vomiting, or diarrhea.  You have a bad smelling vaginal discharge.  You have pain with urination. SEEK IMMEDIATE MEDICAL CARE IF:   You have a fever.  You are leaking fluid from your vagina.  You have spotting or bleeding from your vagina.  You have severe abdominal cramping or pain.  You have rapid weight loss or gain.  You have shortness of breath with chest pain.  You notice sudden or extreme swelling   of your face, hands, ankles, feet, or legs.  You have not felt your baby move in over an hour.  You have severe headaches that do not go away with medicine.  You have vision changes. Document Released: 03/06/2001 Document Revised: 03/17/2013 Document Reviewed: 05/13/2012 [employer] Patient Information 2015 [employer]. This information is not intended to replace advice given to you by your health care provider. Make sure you discuss any questions you have with your health care provider.  

## 2014-01-13 NOTE — Progress Notes (Signed)
Ears better. Good FM. Need to re-request Op note.

## 2014-01-25 ENCOUNTER — Encounter: Payer: Self-pay | Admitting: Advanced Practice Midwife

## 2014-01-27 ENCOUNTER — Ambulatory Visit (INDEPENDENT_AMBULATORY_CARE_PROVIDER_SITE_OTHER): Payer: Medicaid Other | Admitting: Advanced Practice Midwife

## 2014-01-27 VITALS — BP 107/64 | HR 93 | Temp 98.1°F | Wt 219.7 lb

## 2014-01-27 DIAGNOSIS — O3421 Maternal care for scar from previous cesarean delivery: Secondary | ICD-10-CM

## 2014-01-27 DIAGNOSIS — O34219 Maternal care for unspecified type scar from previous cesarean delivery: Secondary | ICD-10-CM

## 2014-01-27 LAB — POCT URINALYSIS DIP (DEVICE)
BILIRUBIN URINE: NEGATIVE
Glucose, UA: NEGATIVE mg/dL
Hgb urine dipstick: NEGATIVE
Ketones, ur: NEGATIVE mg/dL
LEUKOCYTES UA: NEGATIVE
Nitrite: NEGATIVE
Protein, ur: NEGATIVE mg/dL
Specific Gravity, Urine: 1.02 (ref 1.005–1.030)
Urobilinogen, UA: 0.2 mg/dL (ref 0.0–1.0)
pH: 7 (ref 5.0–8.0)

## 2014-01-27 NOTE — Progress Notes (Signed)
Mild cramping, declines exam. PTL precautions. List of peds given. Op note still not under Media tab. Will check status of request. C/S was for breach.

## 2014-01-27 NOTE — Progress Notes (Signed)
Reports occasional cramping.

## 2014-01-27 NOTE — Patient Instructions (Signed)
Braxton Hicks Contractions °Contractions of the uterus can occur throughout pregnancy. Contractions are not always a sign that you are in labor.  °WHAT ARE BRAXTON HICKS CONTRACTIONS?  °Contractions that occur before labor are called Braxton Hicks contractions, or false labor. Toward the end of pregnancy (32-34 weeks), these contractions can develop more often and may become more forceful. This is not true labor because these contractions do not result in opening (dilatation) and thinning of the cervix. They are sometimes difficult to tell apart from true labor because these contractions can be forceful and people have different pain tolerances. You should not feel embarrassed if you go to the hospital with false labor. Sometimes, the only way to tell if you are in true labor is for your health care provider to look for changes in the cervix. °If there are no prenatal problems or other health problems associated with the pregnancy, it is completely safe to be sent home with false labor and await the onset of true labor. °HOW CAN YOU TELL THE DIFFERENCE BETWEEN TRUE AND FALSE LABOR? °False Labor °· The contractions of false labor are usually shorter and not as hard as those of true labor.   °· The contractions are usually irregular.   °· The contractions are often felt in the front of the lower abdomen and in the groin.   °· The contractions may go away when you walk around or change positions while lying down.   °· The contractions get weaker and are shorter lasting as time goes on.   °· The contractions do not usually become progressively stronger, regular, and closer together as with true labor.   °True Labor °1. Contractions in true labor last 30-70 seconds, become very regular, usually become more intense, and increase in frequency.   °2. The contractions do not go away with walking.   °3. The discomfort is usually felt in the top of the uterus and spreads to the lower abdomen and low back.   °4. True labor can  be determined by your health care provider with an exam. This will show that the cervix is dilating and getting thinner.   °WHAT TO REMEMBER °· Keep up with your usual exercises and follow other instructions given by your health care provider.   °· Take medicines as directed by your health care provider.   °· Keep your regular prenatal appointments.   °· Eat and drink lightly if you think you are going into labor.   °· If Braxton Hicks contractions are making you uncomfortable:   °· Change your position from lying down or resting to walking, or from walking to resting.   °· Sit and rest in a tub of warm water.   °· Drink 2-3 glasses of water. Dehydration may cause these contractions.   °· Do slow and deep breathing several times an hour.   °WHEN SHOULD I SEEK IMMEDIATE MEDICAL CARE? °Seek immediate medical care if: °· Your contractions become stronger, more regular, and closer together.   °· You have fluid leaking or gushing from your vagina.   °· You have a fever.   °· You pass blood-tinged mucus.   °· You have vaginal bleeding.   °· You have continuous abdominal pain.   °· You have low back pain that you never had before.   °· You feel your baby's head pushing down and causing pelvic pressure.   °· Your baby is not moving as much as it used to.   °Document Released: 03/12/2005 Document Revised: 03/17/2013 Document Reviewed: 12/22/2012 °[employer]® Patient Information ©2015 [employer]. This information is not intended to replace advice given to you by your health care   provider. Make sure you discuss any questions you have with your health care provider. ° °Fetal Movement Counts °Patient Name: __________________________________________________ Patient Due Date: ____________________ °Performing a fetal movement count is highly recommended in high-risk pregnancies, but it is good for every pregnant woman to do. Your health care provider may ask you to start counting fetal movements at 28 weeks of the pregnancy. Fetal  movements often increase: °· After eating a full meal. °· After physical activity. °· After eating or drinking something sweet or cold. °· At rest. °Pay attention to when you feel the baby is most active. This will help you notice a pattern of your baby's sleep and wake cycles and what factors contribute to an increase in fetal movement. It is important to perform a fetal movement count at the same time each day when your baby is normally most active.  °HOW TO COUNT FETAL MOVEMENTS °5. Find a quiet and comfortable area to sit or lie down on your left side. Lying on your left side provides the best blood and oxygen circulation to your baby. °6. Write down the day and time on a sheet of paper or in a journal. °7. Start counting kicks, flutters, swishes, rolls, or jabs in a 2-hour period. You should feel at least 10 movements within 2 hours. °8. If you do not feel 10 movements in 2 hours, wait 2-3 hours and count again. Look for a change in the pattern or not enough counts in 2 hours. °SEEK MEDICAL CARE IF: °· You feel less than 10 counts in 2 hours, tried twice. °· There is no movement in over an hour. °· The pattern is changing or taking longer each day to reach 10 counts in 2 hours. °· You feel the baby is not moving as he or she usually does. °Date: ____________ Movements: ____________ Start time: ____________ Finish time: ____________  °Date: ____________ Movements: ____________ Start time: ____________ Finish time: ____________ °Date: ____________ Movements: ____________ Start time: ____________ Finish time: ____________ °Date: ____________ Movements: ____________ Start time: ____________ Finish time: ____________ °Date: ____________ Movements: ____________ Start time: ____________ Finish time: ____________ °Date: ____________ Movements: ____________ Start time: ____________ Finish time: ____________ °Date: ____________ Movements: ____________ Start time: ____________ Finish time: ____________ °Date: ____________  Movements: ____________ Start time: ____________ Finish time: ____________  °Date: ____________ Movements: ____________ Start time: ____________ Finish time: ____________ °Date: ____________ Movements: ____________ Start time: ____________ Finish time: ____________ °Date: ____________ Movements: ____________ Start time: ____________ Finish time: ____________ °Date: ____________ Movements: ____________ Start time: ____________ Finish time: ____________ °Date: ____________ Movements: ____________ Start time: ____________ Finish time: ____________ °Date: ____________ Movements: ____________ Start time: ____________ Finish time: ____________ °Date: ____________ Movements: ____________ Start time: ____________ Finish time: ____________  °Date: ____________ Movements: ____________ Start time: ____________ Finish time: ____________ °Date: ____________ Movements: ____________ Start time: ____________ Finish time: ____________ °Date: ____________ Movements: ____________ Start time: ____________ Finish time: ____________ °Date: ____________ Movements: ____________ Start time: ____________ Finish time: ____________ °Date: ____________ Movements: ____________ Start time: ____________ Finish time: ____________ °Date: ____________ Movements: ____________ Start time: ____________ Finish time: ____________ °Date: ____________ Movements: ____________ Start time: ____________ Finish time: ____________  °Date: ____________ Movements: ____________ Start time: ____________ Finish time: ____________ °Date: ____________ Movements: ____________ Start time: ____________ Finish time: ____________ °Date: ____________ Movements: ____________ Start time: ____________ Finish time: ____________ °Date: ____________ Movements: ____________ Start time: ____________ Finish time: ____________ °Date: ____________ Movements: ____________ Start time: ____________ Finish time: ____________ °Date: ____________ Movements: ____________ Start time:  ____________ Finish time: ____________ °Date: ____________ Movements:   ____________ Start time: ____________ Finish time: ____________  °Date: ____________ Movements: ____________ Start time: ____________ Finish time: ____________ °Date: ____________ Movements: ____________ Start time: ____________ Finish time: ____________ °Date: ____________ Movements: ____________ Start time: ____________ Finish time: ____________ °Date: ____________ Movements: ____________ Start time: ____________ Finish time: ____________ °Date: ____________ Movements: ____________ Start time: ____________ Finish time: ____________ °Date: ____________ Movements: ____________ Start time: ____________ Finish time: ____________ °Date: ____________ Movements: ____________ Start time: ____________ Finish time: ____________  °Date: ____________ Movements: ____________ Start time: ____________ Finish time: ____________ °Date: ____________ Movements: ____________ Start time: ____________ Finish time: ____________ °Date: ____________ Movements: ____________ Start time: ____________ Finish time: ____________ °Date: ____________ Movements: ____________ Start time: ____________ Finish time: ____________ °Date: ____________ Movements: ____________ Start time: ____________ Finish time: ____________ °Date: ____________ Movements: ____________ Start time: ____________ Finish time: ____________ °Date: ____________ Movements: ____________ Start time: ____________ Finish time: ____________  °Date: ____________ Movements: ____________ Start time: ____________ Finish time: ____________ °Date: ____________ Movements: ____________ Start time: ____________ Finish time: ____________ °Date: ____________ Movements: ____________ Start time: ____________ Finish time: ____________ °Date: ____________ Movements: ____________ Start time: ____________ Finish time: ____________ °Date: ____________ Movements: ____________ Start time: ____________ Finish time: ____________ °Date:  ____________ Movements: ____________ Start time: ____________ Finish time: ____________ °Date: ____________ Movements: ____________ Start time: ____________ Finish time: ____________  °Date: ____________ Movements: ____________ Start time: ____________ Finish time: ____________ °Date: ____________ Movements: ____________ Start time: ____________ Finish time: ____________ °Date: ____________ Movements: ____________ Start time: ____________ Finish time: ____________ °Date: ____________ Movements: ____________ Start time: ____________ Finish time: ____________ °Date: ____________ Movements: ____________ Start time: ____________ Finish time: ____________ °Date: ____________ Movements: ____________ Start time: ____________ Finish time: ____________ °Document Released: 04/11/2006 Document Revised: 07/27/2013 Document Reviewed: 01/07/2012 °[employer]® Patient Information ©2015 [employer]. This information is not intended to replace advice given to you by your health care provider. Make sure you discuss any questions you have with your health care provider. ° °

## 2014-02-03 ENCOUNTER — Encounter: Payer: Medicaid Other | Admitting: Advanced Practice Midwife

## 2014-02-10 ENCOUNTER — Ambulatory Visit (INDEPENDENT_AMBULATORY_CARE_PROVIDER_SITE_OTHER): Payer: Medicaid Other | Admitting: Family

## 2014-02-10 ENCOUNTER — Other Ambulatory Visit: Payer: Self-pay | Admitting: Family

## 2014-02-10 VITALS — BP 96/66 | HR 92 | Temp 97.6°F | Wt 221.2 lb

## 2014-02-10 DIAGNOSIS — Z3493 Encounter for supervision of normal pregnancy, unspecified, third trimester: Secondary | ICD-10-CM

## 2014-02-10 LAB — POCT URINALYSIS DIP (DEVICE)
Bilirubin Urine: NEGATIVE
GLUCOSE, UA: NEGATIVE mg/dL
Hgb urine dipstick: NEGATIVE
Leukocytes, UA: NEGATIVE
Nitrite: NEGATIVE
Protein, ur: NEGATIVE mg/dL
Specific Gravity, Urine: 1.02 (ref 1.005–1.030)
Urobilinogen, UA: 1 mg/dL (ref 0.0–1.0)
pH: 7 (ref 5.0–8.0)

## 2014-02-10 LAB — OB RESULTS CONSOLE GC/CHLAMYDIA
CHLAMYDIA, DNA PROBE: NEGATIVE
Gonorrhea: NEGATIVE

## 2014-02-10 LAB — OB RESULTS CONSOLE GBS: GBS: NEGATIVE

## 2014-02-10 NOTE — Progress Notes (Signed)
Doing well, no questions or concerns.  GBS and GC and chlamydia collected.  Urine results not available at discharge.

## 2014-02-10 NOTE — Addendum Note (Signed)
Addended by: Faythe CasaBELLAMY, Eirik Schueler M on: 02/10/2014 09:52 AM   Modules accepted: Orders

## 2014-02-11 LAB — GC/CHLAMYDIA PROBE AMP
CT Probe RNA: NEGATIVE
GC Probe RNA: NEGATIVE

## 2014-02-13 LAB — CULTURE, BETA STREP (GROUP B ONLY)

## 2014-02-17 ENCOUNTER — Ambulatory Visit (INDEPENDENT_AMBULATORY_CARE_PROVIDER_SITE_OTHER): Payer: Medicaid Other | Admitting: Obstetrics & Gynecology

## 2014-02-17 VITALS — BP 122/78 | HR 97 | Temp 97.5°F | Wt 224.9 lb

## 2014-02-17 DIAGNOSIS — Z3492 Encounter for supervision of normal pregnancy, unspecified, second trimester: Secondary | ICD-10-CM

## 2014-02-17 DIAGNOSIS — Z3482 Encounter for supervision of other normal pregnancy, second trimester: Secondary | ICD-10-CM

## 2014-02-17 LAB — POCT URINALYSIS DIP (DEVICE)
BILIRUBIN URINE: NEGATIVE
GLUCOSE, UA: NEGATIVE mg/dL
Hgb urine dipstick: NEGATIVE
Ketones, ur: NEGATIVE mg/dL
Nitrite: NEGATIVE
Protein, ur: NEGATIVE mg/dL
Specific Gravity, Urine: 1.02 (ref 1.005–1.030)
Urobilinogen, UA: 2 mg/dL — ABNORMAL HIGH (ref 0.0–1.0)
pH: 6 (ref 5.0–8.0)

## 2014-02-17 NOTE — Progress Notes (Signed)
Pt reports feeling crampy

## 2014-02-17 NOTE — Progress Notes (Signed)
Occasional UC no ROM VB. US confirms cephalic today. Consented for TOLAC

## 2014-02-17 NOTE — Patient Instructions (Signed)
Vaginal Birth After Cesarean Delivery Vaginal birth after cesarean delivery (VBAC) is giving birth vaginally after previously delivering a baby by a cesarean. In the past, if a woman had a cesarean delivery, all births afterward would be done by cesarean delivery. This is no longer true. It can be safe for the mother to try a vaginal delivery after having a cesarean delivery.  It is important to discuss VBAC with your health care provider early in the pregnancy so you can understand the risks, benefits, and options. It will give you time to decide what is best in your particular case. The final decision about whether to have a VBAC or repeat cesarean delivery should be between you and your health care provider. Any changes in your health or your baby's health during your pregnancy may make it necessary to change your initial decision about VBAC.  WOMEN WHO PLAN TO HAVE A VBAC SHOULD CHECK WITH THEIR HEALTH CARE PROVIDER TO BE SURE THAT:  The previous cesarean delivery was done with a low transverse uterine cut (incision) (not a vertical classical incision).   The birth canal is big enough for the baby.   There were no other operations on the uterus.   An electronic fetal monitor (EFM) will be on at all times during labor.   An operating room will be available and ready in case an emergency cesarean delivery is needed.   A health care provider and surgical nursing staff will be available at all times during labor to be ready to do an emergency delivery cesarean if necessary.   An anesthesiologist will be present in case an emergency cesarean delivery is needed.   The nursery is prepared and has adequate personnel and necessary equipment available to care for the baby in case of an emergency cesarean delivery. BENEFITS OF VBAC  Shorter stay in the hospital.   Avoidance of risks associated with cesarean delivery, such as:  Surgical complications, such as opening of the incision or  hernia in the incision.  Injury to other organs.  Fever. This can occur if an infection develops after surgery. It can also occur as a reaction to the medicine given to make you numb during the surgery.  Less blood loss and need for blood transfusions.  Lower risk of blood clots and infection.  Shorter recovery.   Decreased risk for having to remove the uterus (hysterectomy).   Decreased risk for the placenta to completely or partially cover the opening of the uterus (placenta previa) with a future pregnancy.   Decrease risk in future labor and delivery. RISKS OF A VBAC  Tearing (rupture) of the uterus. This is occurs in less than 1% of VBACs. The risk of this happening is higher if:  Steps are taken to begin the labor process (induce labor) or stimulate or strengthen contractions (augment labor).   Medicine is used to soften (ripen) the cervix.  Having to remove the uterus (hysterectomy) if it ruptures. VBAC SHOULD NOT BE DONE IF:  The previous cesarean delivery was done with a vertical (classical) or T-shaped incision or you do not know what kind of incision was made.   You had a ruptured uterus.   You have had certain types of surgery on your uterus, such as removal of uterine fibroids. Ask your health care provider about other types of surgeries that prevent you from having a VBAC.  You have certain medical or childbirth (obstetrical) problems.   There are problems with the baby.   You   have had two previous cesarean deliveries and no vaginal deliveries. OTHER FACTS TO KNOW ABOUT VBAC:  It is safe to have an epidural anesthetic with VBAC.   It is safe to turn the baby from a breech position (attempt an external cephalic version).   It is safe to try a VBAC with twins.   VBAC may not be successful if your baby weights 8.8 lb (4 kg) or more. However, weight predictions are not always accurate and should not be used alone to decide if VBAC is right for  you.  There is an increased failure rate if the time between the cesarean delivery and VBAC is less than 19 months.   Your health care provider may advise against a VBAC if you have preeclampsia (high blood pressure, protein in the urine, and swelling of face and extremities).   VBAC is often successful if you previously gave birth vaginally.   VBAC is often successful when the labor starts spontaneously before the due date.   Delivering a baby through a VBAC is similar to having a normal spontaneous vaginal delivery. Document Released: 09/02/2006 Document Revised: 07/27/2013 Document Reviewed: 10/09/2012 [employer] Patient Information 2015 [employer]. This information is not intended to replace advice given to you by your health care provider. Make sure you discuss any questions you have with your health care provider.  

## 2014-02-24 ENCOUNTER — Ambulatory Visit (INDEPENDENT_AMBULATORY_CARE_PROVIDER_SITE_OTHER): Payer: Medicaid Other | Admitting: Obstetrics and Gynecology

## 2014-02-24 VITALS — BP 112/73 | HR 85 | Temp 97.5°F | Wt 224.2 lb

## 2014-02-24 DIAGNOSIS — Z3493 Encounter for supervision of normal pregnancy, unspecified, third trimester: Secondary | ICD-10-CM

## 2014-02-24 LAB — POCT URINALYSIS DIP (DEVICE)
Bilirubin Urine: NEGATIVE
Glucose, UA: NEGATIVE mg/dL
KETONES UR: NEGATIVE mg/dL
LEUKOCYTES UA: NEGATIVE
Nitrite: NEGATIVE
Protein, ur: NEGATIVE mg/dL
Specific Gravity, Urine: 1.02 (ref 1.005–1.030)
Urobilinogen, UA: 0.2 mg/dL (ref 0.0–1.0)
pH: 6.5 (ref 5.0–8.0)

## 2014-02-24 NOTE — Progress Notes (Signed)
Doing well. Good FM. Plans reviewed.

## 2014-02-24 NOTE — Patient Instructions (Signed)
Third Trimester of Pregnancy The third trimester is from week 29 through week 42, months 7 through 9. The third trimester is a time when the fetus is growing rapidly. At the end of the ninth month, the fetus is about 20 inches in length and weighs 6-10 pounds.  BODY CHANGES Your body goes through many changes during pregnancy. The changes vary from woman to woman.   Your weight will continue to increase. You can expect to gain 25-35 pounds (11-16 kg) by the end of the pregnancy.  You may begin to get stretch marks on your hips, abdomen, and breasts.  You may urinate more often because the fetus is moving lower into your pelvis and pressing on your bladder.  You may develop or continue to have heartburn as a result of your pregnancy.  You may develop constipation because certain hormones are causing the muscles that push waste through your intestines to slow down.  You may develop hemorrhoids or swollen, bulging veins (varicose veins).  You may have pelvic pain because of the weight gain and pregnancy hormones relaxing your joints between the bones in your pelvis. Backaches may result from overexertion of the muscles supporting your posture.  You may have changes in your hair. These can include thickening of your hair, rapid growth, and changes in texture. Some women also have hair loss during or after pregnancy, or hair that feels dry or thin. Your hair will most likely return to normal after your baby is born.  Your breasts will continue to grow and be tender. A yellow discharge may leak from your breasts called colostrum.  Your belly button may stick out.  You may feel short of breath because of your expanding uterus.  You may notice the fetus "dropping," or moving lower in your abdomen.  You may have a bloody mucus discharge. This usually occurs a few days to a week before labor begins.  Your cervix becomes thin and soft (effaced) near your due date. WHAT TO EXPECT AT YOUR PRENATAL  EXAMS  You will have prenatal exams every 2 weeks until week 36. Then, you will have weekly prenatal exams. During a routine prenatal visit:  You will be weighed to make sure you and the fetus are growing normally.  Your blood pressure is taken.  Your abdomen will be measured to track your baby's growth.  The fetal heartbeat will be listened to.  Any test results from the previous visit will be discussed.  You may have a cervical check near your due date to see if you have effaced. At around 36 weeks, your caregiver will check your cervix. At the same time, your caregiver will also perform a test on the secretions of the vaginal tissue. This test is to determine if a type of bacteria, Group B streptococcus, is present. Your caregiver will explain this further. Your caregiver may ask you:  What your birth plan is.  How you are feeling.  If you are feeling the baby move.  If you have had any abnormal symptoms, such as leaking fluid, bleeding, severe headaches, or abdominal cramping.  If you have any questions. Other tests or screenings that may be performed during your third trimester include:  Blood tests that check for low iron levels (anemia).  Fetal testing to check the health, activity level, and growth of the fetus. Testing is done if you have certain medical conditions or if there are problems during the pregnancy. FALSE LABOR You may feel small, irregular contractions that   eventually go away. These are called Braxton Hicks contractions, or false labor. Contractions may last for hours, days, or even weeks before true labor sets in. If contractions come at regular intervals, intensify, or become painful, it is best to be seen by your caregiver.  SIGNS OF LABOR   Menstrual-like cramps.  Contractions that are 5 minutes apart or less.  Contractions that start on the top of the uterus and spread down to the lower abdomen and back.  A sense of increased pelvic pressure or back  pain.  A watery or bloody mucus discharge that comes from the vagina. If you have any of these signs before the 37th week of pregnancy, call your caregiver right away. You need to go to the hospital to get checked immediately. HOME CARE INSTRUCTIONS   Avoid all smoking, herbs, alcohol, and unprescribed drugs. These chemicals affect the formation and growth of the baby.  Follow your caregiver's instructions regarding medicine use. There are medicines that are either safe or unsafe to take during pregnancy.  Exercise only as directed by your caregiver. Experiencing uterine cramps is a good sign to stop exercising.  Continue to eat regular, healthy meals.  Wear a good support bra for breast tenderness.  Do not use hot tubs, steam rooms, or saunas.  Wear your seat belt at all times when driving.  Avoid raw meat, uncooked cheese, cat litter boxes, and soil used by cats. These carry germs that can cause birth defects in the baby.  Take your prenatal vitamins.  Try taking a stool softener (if your caregiver approves) if you develop constipation. Eat more high-fiber foods, such as fresh vegetables or fruit and whole grains. Drink plenty of fluids to keep your urine clear or pale yellow.  Take warm sitz baths to soothe any pain or discomfort caused by hemorrhoids. Use hemorrhoid cream if your caregiver approves.  If you develop varicose veins, wear support hose. Elevate your feet for 15 minutes, 3-4 times a day. Limit salt in your diet.  Avoid heavy lifting, wear low heal shoes, and practice good posture.  Rest a lot with your legs elevated if you have leg cramps or low back pain.  Visit your dentist if you have not gone during your pregnancy. Use a soft toothbrush to brush your teeth and be gentle when you floss.  A sexual relationship may be continued unless your caregiver directs you otherwise.  Do not travel far distances unless it is absolutely necessary and only with the approval  of your caregiver.  Take prenatal classes to understand, practice, and ask questions about the labor and delivery.  Make a trial run to the hospital.  Pack your hospital bag.  Prepare the baby's nursery.  Continue to go to all your prenatal visits as directed by your caregiver. SEEK MEDICAL CARE IF:  You are unsure if you are in labor or if your water has broken.  You have dizziness.  You have mild pelvic cramps, pelvic pressure, or nagging pain in your abdominal area.  You have persistent nausea, vomiting, or diarrhea.  You have a bad smelling vaginal discharge.  You have pain with urination. SEEK IMMEDIATE MEDICAL CARE IF:   You have a fever.  You are leaking fluid from your vagina.  You have spotting or bleeding from your vagina.  You have severe abdominal cramping or pain.  You have rapid weight loss or gain.  You have shortness of breath with chest pain.  You notice sudden or extreme swelling   of your face, hands, ankles, feet, or legs.  You have not felt your baby move in over an hour.  You have severe headaches that do not go away with medicine.  You have vision changes. Document Released: 03/06/2001 Document Revised: 03/17/2013 Document Reviewed: 05/13/2012 [employer] Patient Information 2015 [employer]. This information is not intended to replace advice given to you by your health care provider. Make sure you discuss any questions you have with your health care provider.  

## 2014-03-03 ENCOUNTER — Encounter: Payer: Self-pay | Admitting: Advanced Practice Midwife

## 2014-03-03 ENCOUNTER — Ambulatory Visit (INDEPENDENT_AMBULATORY_CARE_PROVIDER_SITE_OTHER): Payer: Medicaid Other | Admitting: Advanced Practice Midwife

## 2014-03-03 VITALS — BP 110/74 | HR 83 | Temp 97.8°F | Wt 226.8 lb

## 2014-03-03 DIAGNOSIS — O3421 Maternal care for scar from previous cesarean delivery: Secondary | ICD-10-CM

## 2014-03-03 DIAGNOSIS — O34219 Maternal care for unspecified type scar from previous cesarean delivery: Secondary | ICD-10-CM

## 2014-03-03 LAB — POCT URINALYSIS DIP (DEVICE)
Bilirubin Urine: NEGATIVE
GLUCOSE, UA: NEGATIVE mg/dL
Ketones, ur: NEGATIVE mg/dL
Nitrite: NEGATIVE
Protein, ur: 30 mg/dL — AB
SPECIFIC GRAVITY, URINE: 1.02 (ref 1.005–1.030)
UROBILINOGEN UA: 0.2 mg/dL (ref 0.0–1.0)
pH: 6.5 (ref 5.0–8.0)

## 2014-03-03 NOTE — Progress Notes (Signed)
Discussed where to go when comes in. Discussed early discharge if desired.

## 2014-03-03 NOTE — Progress Notes (Signed)
Handout on [place] given

## 2014-03-03 NOTE — Patient Instructions (Signed)

## 2014-03-08 ENCOUNTER — Inpatient Hospital Stay (HOSPITAL_COMMUNITY)
Admission: AD | Admit: 2014-03-08 | Discharge: 2014-03-08 | Disposition: A | Discharge status: Home / Self Care | Payer: Medicaid Other | Source: Ambulatory Visit | Attending: Family Medicine | Admitting: Family Medicine

## 2014-03-08 ENCOUNTER — Encounter (HOSPITAL_COMMUNITY): Payer: Self-pay | Admitting: *Deleted

## 2014-03-08 DIAGNOSIS — Z3A40 40 weeks gestation of pregnancy: Secondary | ICD-10-CM | POA: Insufficient documentation

## 2014-03-08 DIAGNOSIS — O471 False labor at or after 37 completed weeks of gestation: Secondary | ICD-10-CM | POA: Diagnosis present

## 2014-03-08 NOTE — Discharge Instructions (Signed)
Third Trimester of Pregnancy The third trimester is from week 29 through week 42, months 7 through 9. The third trimester is a time when the fetus is growing rapidly. At the end of the ninth month, the fetus is about 20 inches in length and weighs 6-10 pounds.  BODY CHANGES Your body goes through many changes during pregnancy. The changes vary from woman to woman.   Your weight will continue to increase. You can expect to gain 25-35 pounds (11-16 kg) by the end of the pregnancy.  You may begin to get stretch marks on your hips, abdomen, and breasts.  You may urinate more often because the fetus is moving lower into your pelvis and pressing on your bladder.  You may develop or continue to have heartburn as a result of your pregnancy.  You may develop constipation because certain hormones are causing the muscles that push waste through your intestines to slow down.  You may develop hemorrhoids or swollen, bulging veins (varicose veins).  You may have pelvic pain because of the weight gain and pregnancy hormones relaxing your joints between the bones in your pelvis. Backaches may result from overexertion of the muscles supporting your posture.  You may have changes in your hair. These can include thickening of your hair, rapid growth, and changes in texture. Some women also have hair loss during or after pregnancy, or hair that feels dry or thin. Your hair will most likely return to normal after your baby is born.  Your breasts will continue to grow and be tender. A yellow discharge may leak from your breasts called colostrum.  Your belly button may stick out.  You may feel short of breath because of your expanding uterus.  You may notice the fetus "dropping," or moving lower in your abdomen.  You may have a bloody mucus discharge. This usually occurs a few days to a week before labor begins.  Your cervix becomes thin and soft (effaced) near your due date. WHAT TO EXPECT AT YOUR PRENATAL  EXAMS  You will have prenatal exams every 2 weeks until week 36. Then, you will have weekly prenatal exams. During a routine prenatal visit:  You will be weighed to make sure you and the fetus are growing normally.  Your blood pressure is taken.  Your abdomen will be measured to track your baby's growth.  The fetal heartbeat will be listened to.  Any test results from the previous visit will be discussed.  You may have a cervical check near your due date to see if you have effaced. At around 36 weeks, your caregiver will check your cervix. At the same time, your caregiver will also perform a test on the secretions of the vaginal tissue. This test is to determine if a type of bacteria, Group B streptococcus, is present. Your caregiver will explain this further. Your caregiver may ask you:  What your birth plan is.  How you are feeling.  If you are feeling the baby move.  If you have had any abnormal symptoms, such as leaking fluid, bleeding, severe headaches, or abdominal cramping.  If you have any questions. Other tests or screenings that may be performed during your third trimester include:  Blood tests that check for low iron levels (anemia).  Fetal testing to check the health, activity level, and growth of the fetus. Testing is done if you have certain medical conditions or if there are problems during the pregnancy. FALSE LABOR You may feel small, irregular contractions that   eventually go away. These are called Braxton Hicks contractions, or false labor. Contractions may last for hours, days, or even weeks before true labor sets in. If contractions come at regular intervals, intensify, or become painful, it is best to be seen by your caregiver.  SIGNS OF LABOR   Menstrual-like cramps.  Contractions that are 5 minutes apart or less.  Contractions that start on the top of the uterus and spread down to the lower abdomen and back.  A sense of increased pelvic pressure or back  pain.  A watery or bloody mucus discharge that comes from the vagina. If you have any of these signs before the 37th week of pregnancy, call your caregiver right away. You need to go to the hospital to get checked immediately. HOME CARE INSTRUCTIONS   Avoid all smoking, herbs, alcohol, and unprescribed drugs. These chemicals affect the formation and growth of the baby.  Follow your caregiver's instructions regarding medicine use. There are medicines that are either safe or unsafe to take during pregnancy.  Exercise only as directed by your caregiver. Experiencing uterine cramps is a good sign to stop exercising.  Continue to eat regular, healthy meals.  Wear a good support bra for breast tenderness.  Do not use hot tubs, steam rooms, or saunas.  Wear your seat belt at all times when driving.  Avoid raw meat, uncooked cheese, cat litter boxes, and soil used by cats. These carry germs that can cause birth defects in the baby.  Take your prenatal vitamins.  Try taking a stool softener (if your caregiver approves) if you develop constipation. Eat more high-fiber foods, such as fresh vegetables or fruit and whole grains. Drink plenty of fluids to keep your urine clear or pale yellow.  Take warm sitz baths to soothe any pain or discomfort caused by hemorrhoids. Use hemorrhoid cream if your caregiver approves.  If you develop varicose veins, wear support hose. Elevate your feet for 15 minutes, 3-4 times a day. Limit salt in your diet.  Avoid heavy lifting, wear low heal shoes, and practice good posture.  Rest a lot with your legs elevated if you have leg cramps or low back pain.  Visit your dentist if you have not gone during your pregnancy. Use a soft toothbrush to brush your teeth and be gentle when you floss.  A sexual relationship may be continued unless your caregiver directs you otherwise.  Do not travel far distances unless it is absolutely necessary and only with the approval  of your caregiver.  Take prenatal classes to understand, practice, and ask questions about the labor and delivery.  Make a trial run to the hospital.  Pack your hospital bag.  Prepare the baby's nursery.  Continue to go to all your prenatal visits as directed by your caregiver. SEEK MEDICAL CARE IF:  You are unsure if you are in labor or if your water has broken.  You have dizziness.  You have mild pelvic cramps, pelvic pressure, or nagging pain in your abdominal area.  You have persistent nausea, vomiting, or diarrhea.  You have a bad smelling vaginal discharge.  You have pain with urination. SEEK IMMEDIATE MEDICAL CARE IF:   You have a fever.  You are leaking fluid from your vagina.  You have spotting or bleeding from your vagina.  You have severe abdominal cramping or pain.  You have rapid weight loss or gain.  You have shortness of breath with chest pain.  You notice sudden or extreme swelling   of your face, hands, ankles, feet, or legs.  You have not felt your baby move in over an hour.  You have severe headaches that do not go away with medicine.  You have vision changes. Document Released: 03/06/2001 Document Revised: 03/17/2013 Document Reviewed: 05/13/2012 [employer] Patient Information 2015 [employer]. This information is not intended to replace advice given to you by your health care provider. Make sure you discuss any questions you have with your health care provider.  

## 2014-03-08 NOTE — MAU Note (Signed)
Pt states that she has been ctx x 5 hours, q4-7 min apart pain6/10, Denies LOF and VB, +FM, +mucous discharge.

## 2014-03-10 ENCOUNTER — Inpatient Hospital Stay (HOSPITAL_COMMUNITY): Payer: Medicaid Other | Admitting: Anesthesiology

## 2014-03-10 ENCOUNTER — Encounter: Payer: Medicaid Other | Admitting: Obstetrics and Gynecology

## 2014-03-10 ENCOUNTER — Encounter (HOSPITAL_COMMUNITY): Payer: Self-pay | Admitting: *Deleted

## 2014-03-10 ENCOUNTER — Inpatient Hospital Stay (HOSPITAL_COMMUNITY)
Admission: AD | Admit: 2014-03-10 | Discharge: 2014-03-12 | DRG: 775 | Disposition: A | Discharge status: Home / Self Care | Payer: Medicaid Other | Source: Ambulatory Visit | Attending: Family Medicine | Admitting: Family Medicine

## 2014-03-10 DIAGNOSIS — Z833 Family history of diabetes mellitus: Secondary | ICD-10-CM | POA: Diagnosis not present

## 2014-03-10 DIAGNOSIS — Z8249 Family history of ischemic heart disease and other diseases of the circulatory system: Secondary | ICD-10-CM | POA: Diagnosis not present

## 2014-03-10 DIAGNOSIS — O3421 Maternal care for scar from previous cesarean delivery: Principal | ICD-10-CM | POA: Diagnosis present

## 2014-03-10 DIAGNOSIS — Z8759 Personal history of other complications of pregnancy, childbirth and the puerperium: Secondary | ICD-10-CM | POA: Diagnosis not present

## 2014-03-10 DIAGNOSIS — IMO0001 Reserved for inherently not codable concepts without codable children: Secondary | ICD-10-CM

## 2014-03-10 DIAGNOSIS — Z3A40 40 weeks gestation of pregnancy: Secondary | ICD-10-CM | POA: Diagnosis present

## 2014-03-10 LAB — CBC
HEMATOCRIT: 36.4 % (ref 36.0–46.0)
Hemoglobin: 12.2 g/dL (ref 12.0–15.0)
MCH: 28.2 pg (ref 26.0–34.0)
MCHC: 33.5 g/dL (ref 30.0–36.0)
MCV: 84.3 fL (ref 78.0–100.0)
Platelets: 243 10*3/uL (ref 150–400)
RBC: 4.32 MIL/uL (ref 3.87–5.11)
RDW: 14.5 % (ref 11.5–15.5)
WBC: 8.6 10*3/uL (ref 4.0–10.5)

## 2014-03-10 LAB — TYPE AND SCREEN
ABO/RH(D): O POS
ANTIBODY SCREEN: NEGATIVE

## 2014-03-10 LAB — ABO/RH: ABO/RH(D): O POS

## 2014-03-10 LAB — HIV ANTIBODY (ROUTINE TESTING W REFLEX): HIV: NONREACTIVE

## 2014-03-10 LAB — POCT FERN TEST: POCT FERN TEST: POSITIVE

## 2014-03-10 LAB — RPR

## 2014-03-10 MED ORDER — EPHEDRINE 5 MG/ML INJ
10.0000 mg | INTRAVENOUS | Status: DC | PRN
  Filled 2014-03-10: qty 2

## 2014-03-10 MED ORDER — OXYTOCIN 40 UNITS IN LACTATED RINGERS INFUSION - SIMPLE MED
1.0000 m[IU]/min | INTRAVENOUS | Status: DC
  Administered 2014-03-10: 2 m[IU]/min via INTRAVENOUS
  Administered 2014-03-10: 666 m[IU]/min via INTRAVENOUS
  Filled 2014-03-10: qty 1000

## 2014-03-10 MED ORDER — ONDANSETRON HCL 4 MG PO TABS: 4.0000 mg | ORAL_TABLET | ORAL | Status: DC | PRN

## 2014-03-10 MED ORDER — FENTANYL CITRATE 0.05 MG/ML IJ SOLN
INTRAMUSCULAR | Status: AC
  Administered 2014-03-10: 100 ug via INTRAVENOUS
  Filled 2014-03-10: qty 2

## 2014-03-10 MED ORDER — DIBUCAINE 1 % RE OINT: 1.0000 "application " | TOPICAL_OINTMENT | RECTAL | Status: DC | PRN

## 2014-03-10 MED ORDER — PHENYLEPHRINE 40 MCG/ML (10ML) SYRINGE FOR IV PUSH (FOR BLOOD PRESSURE SUPPORT)
80.0000 ug | PREFILLED_SYRINGE | INTRAVENOUS | Status: DC | PRN
  Filled 2014-03-10: qty 2

## 2014-03-10 MED ORDER — LACTATED RINGERS IV SOLN
INTRAVENOUS | Status: DC
  Administered 2014-03-10 (×4): via INTRAVENOUS

## 2014-03-10 MED ORDER — IBUPROFEN 600 MG PO TABS
600.0000 mg | ORAL_TABLET | Freq: Four times a day (QID) | ORAL | Status: DC
  Administered 2014-03-11 – 2014-03-12 (×7): 600 mg via ORAL
  Filled 2014-03-10 (×7): qty 1

## 2014-03-10 MED ORDER — FENTANYL CITRATE 0.05 MG/ML IJ SOLN
100.0000 ug | INTRAMUSCULAR | Status: AC | PRN
  Administered 2014-03-10 (×3): 100 ug via INTRAVENOUS
  Filled 2014-03-10 (×2): qty 2

## 2014-03-10 MED ORDER — OXYCODONE-ACETAMINOPHEN 5-325 MG PO TABS: 2.0000 | ORAL_TABLET | ORAL | Status: DC | PRN

## 2014-03-10 MED ORDER — PHENYLEPHRINE 40 MCG/ML (10ML) SYRINGE FOR IV PUSH (FOR BLOOD PRESSURE SUPPORT)
80.0000 ug | PREFILLED_SYRINGE | INTRAVENOUS | Status: DC | PRN
  Filled 2014-03-10: qty 2
  Filled 2014-03-10: qty 10

## 2014-03-10 MED ORDER — WITCH HAZEL-GLYCERIN EX PADS: 1.0000 "application " | MEDICATED_PAD | CUTANEOUS | Status: DC | PRN

## 2014-03-10 MED ORDER — LANOLIN HYDROUS EX OINT: TOPICAL_OINTMENT | CUTANEOUS | Status: DC | PRN

## 2014-03-10 MED ORDER — FLEET ENEMA 7-19 GM/118ML RE ENEM: 1.0000 | ENEMA | RECTAL | Status: DC | PRN

## 2014-03-10 MED ORDER — OXYTOCIN BOLUS FROM INFUSION: 500.0000 mL | INTRAVENOUS | Status: DC

## 2014-03-10 MED ORDER — BENZOCAINE-MENTHOL 20-0.5 % EX AERO
1.0000 "application " | INHALATION_SPRAY | CUTANEOUS | Status: DC | PRN
  Filled 2014-03-10: qty 56

## 2014-03-10 MED ORDER — PRENATAL MULTIVITAMIN CH
1.0000 | ORAL_TABLET | Freq: Every day | ORAL | Status: DC
  Administered 2014-03-11 – 2014-03-12 (×2): 1 via ORAL
  Filled 2014-03-10 (×2): qty 1

## 2014-03-10 MED ORDER — LACTATED RINGERS IV SOLN: 500.0000 mL | INTRAVENOUS | Status: DC | PRN

## 2014-03-10 MED ORDER — CITRIC ACID-SODIUM CITRATE 334-500 MG/5ML PO SOLN: 30.0000 mL | ORAL | Status: DC | PRN

## 2014-03-10 MED ORDER — ONDANSETRON HCL 4 MG/2ML IJ SOLN: 4.0000 mg | INTRAMUSCULAR | Status: DC | PRN

## 2014-03-10 MED ORDER — DIPHENHYDRAMINE HCL 25 MG PO CAPS: 25.0000 mg | ORAL_CAPSULE | Freq: Four times a day (QID) | ORAL | Status: DC | PRN

## 2014-03-10 MED ORDER — OXYTOCIN 40 UNITS IN LACTATED RINGERS INFUSION - SIMPLE MED: 62.5000 mL/h | INTRAVENOUS | Status: DC

## 2014-03-10 MED ORDER — DIPHENHYDRAMINE HCL 50 MG/ML IJ SOLN: 12.5000 mg | INTRAMUSCULAR | Status: DC | PRN

## 2014-03-10 MED ORDER — FENTANYL 2.5 MCG/ML BUPIVACAINE 1/10 % EPIDURAL INFUSION (WH - ANES)
14.0000 mL/h | INTRAMUSCULAR | Status: DC | PRN
  Administered 2014-03-10: 14 mL/h via EPIDURAL
  Filled 2014-03-10: qty 125

## 2014-03-10 MED ORDER — OXYCODONE-ACETAMINOPHEN 5-325 MG PO TABS: 1.0000 | ORAL_TABLET | ORAL | Status: DC | PRN

## 2014-03-10 MED ORDER — ONDANSETRON HCL 4 MG/2ML IJ SOLN: 4.0000 mg | Freq: Four times a day (QID) | INTRAMUSCULAR | Status: DC | PRN

## 2014-03-10 MED ORDER — SIMETHICONE 80 MG PO CHEW: 80.0000 mg | CHEWABLE_TABLET | ORAL | Status: DC | PRN

## 2014-03-10 MED ORDER — SENNOSIDES-DOCUSATE SODIUM 8.6-50 MG PO TABS
2.0000 | ORAL_TABLET | ORAL | Status: DC
  Administered 2014-03-11: 2 via ORAL
  Filled 2014-03-10 (×2): qty 2

## 2014-03-10 MED ORDER — LIDOCAINE HCL (PF) 1 % IJ SOLN
INTRAMUSCULAR | Status: DC | PRN
  Administered 2014-03-10 (×2): 5 mL

## 2014-03-10 MED ORDER — TETANUS-DIPHTH-ACELL PERTUSSIS 5-2.5-18.5 LF-MCG/0.5 IM SUSP: 0.5000 mL | Freq: Once | INTRAMUSCULAR | Status: DC

## 2014-03-10 MED ORDER — LACTATED RINGERS IV SOLN: 500.0000 mL | Freq: Once | INTRAVENOUS | Status: DC

## 2014-03-10 MED ORDER — TERBUTALINE SULFATE 1 MG/ML IJ SOLN: 0.2500 mg | Freq: Once | INTRAMUSCULAR | Status: DC | PRN

## 2014-03-10 MED ORDER — LIDOCAINE HCL (PF) 1 % IJ SOLN
30.0000 mL | INTRAMUSCULAR | Status: DC | PRN
  Administered 2014-03-10: 30 mL via SUBCUTANEOUS
  Filled 2014-03-10: qty 30

## 2014-03-10 MED ORDER — ZOLPIDEM TARTRATE 5 MG PO TABS: 5.0000 mg | ORAL_TABLET | Freq: Every evening | ORAL | Status: DC | PRN

## 2014-03-10 MED ORDER — ACETAMINOPHEN 325 MG PO TABS: 650.0000 mg | ORAL_TABLET | ORAL | Status: DC | PRN

## 2014-03-10 NOTE — H&P (Signed)
Doris Mays is a 28 y.o. female G2P1001 at 131w2d by 11wk US presenting for ROM and SOL.  She reports ROM around 0130 this AM and contractions q2-4 min restarting around 0200.  Was seen in MAU for labor eval yesterday.  Previous C/S for breech, desires TOLAC.  VBAC consent signed.    Having boy - no circ, plans to breastfeed, and plans for minipill for contraception.   Clinic   Low Risk  Dating  11.0 weeks on 08/11/13  Genetic Screen 1 Screen:   Declines       AFP:   normal                 Quad:       ordered    NIPS:  Anatomic US  Normal female  GTT Early:     5498          Third trimester: 103  TDaP vaccine  Received 12/14/13  Flu vaccine  Decline 12/30/13  GBS  Neg  Contraception  Liked patch before, will need non-Estrogen method while breastfeeding  Minipill  Baby Food  Breast  Circumcision  Probably not  Pediatrician  list given  Support Person FOB and daughter     Maternal Medical History:  Reason for admission: Nausea.    OB History    Gravida Para Term Preterm AB TAB SAB Ectopic Multiple Living   2 1 1  0 0 0 0 0 0 1     History reviewed. No pertinent past medical history. Past Surgical History  Procedure Laterality Date  . Cesarean section     Family History: family history includes Diabetes in her father; Hypertension in her other. Social History:  reports that she has never smoked. She has never used smokeless tobacco. She reports that she does not drink alcohol or use illicit drugs.    Review of Systems  Constitutional: Negative for fever, chills and weight loss.  Eyes: Negative for blurred vision.  Respiratory: Negative for cough and shortness of breath.   Cardiovascular: Negative for chest pain and leg swelling.  Gastrointestinal: Negative for heartburn, nausea, vomiting and abdominal pain.  Genitourinary: Negative for dysuria, urgency and frequency.  Musculoskeletal: Negative for back pain.  Skin: Negative for itching and rash.  Neurological: Negative for  headaches.    Dilation: 2.5 Effacement (%): 90 Station: -2 Exam by:: Weston,RN Blood pressure 131/86, pulse 77, temperature 98.4 F (36.9 C), temperature source Oral, resp. rate 18, last menstrual period 06/01/2013. Maternal Exam:  Uterine Assessment: Contraction strength is moderate.  Contraction frequency is regular.   Abdomen: Fetal presentation: vertex  Introitus: Normal vulva. Normal vagina.  Ferning test: positive.  Amniotic fluid character: not assessed.  Pelvis: adequate for delivery.      Fetal Exam Fetal Monitor Review: Baseline rate: 130.  Variability: moderate (6-25 bpm).   Pattern: accelerations present and no decelerations.    Fetal State Assessment: Category I - tracings are normal.     Physical Exam  Constitutional: She is oriented to person, place, and time. She appears well-developed and well-nourished. No distress.  HENT:  Head: Normocephalic and atraumatic.  Cardiovascular: Normal rate and intact distal pulses.   Respiratory: Effort normal. No respiratory distress.  GI: There is no tenderness.  Musculoskeletal: She exhibits no edema or tenderness.  Neurological: She is alert and oriented to person, place, and time.  Skin: Skin is warm and dry.    Prenatal labs: ABO, Rh: O/POS/-- (05/13 1207) Antibody: NEG (05/13 1207) Rubella: 1.54 (05/13  1207) RPR: NON REAC (09/21 1458)  HBsAg: NEGATIVE (05/13 1207)  HIV: NONREACTIVE (09/21 1458)  GBS:   negative  Assessment/Plan: Doris Mays is a 28 y.o. G2P1001 at 3677w2d here for ROM and SOL  #Labor: expectant management #Pain: Desires epidural #FWB: Cat 1 #ID:  GBS negative #MOF: breast #MOC: minipill #Circ:  declines   Shirlee LatchBacigalupo, Angela 03/10/2014, 3:58 AM    I have seen this patient and agree with the above resident's note.  LEFTWICH-KIRBY, Taleigh Gero Certified Nurse-Midwife

## 2014-03-10 NOTE — MAU Note (Signed)
Pt states that she SROM at 0130, contractions began shortly after. Pt states that baby is active.

## 2014-03-10 NOTE — Progress Notes (Signed)
Doris Mays is a 28 y.o. G2P1001 at 3164w2d by ultrasound admitted for rupture of membranes  Subjective: Planning epidural. Desires IV pain meds currently  Objective: BP 126/86 mmHg  Pulse 71  Temp(Src) 97.7 F (36.5 C) (Oral)  Resp 20  Ht 5\' 4"  (1.626 m)  Wt 102.513 kg (226 lb)  BMI 38.77 kg/m2  LMP 06/01/2013      FHT:  FHR: 130 bpm, variability: moderate,  accelerations:  Present,  decelerations:  Absent UC:   irregular SVE:   Dilation: 2.5 Effacement (%): 90 Station: -2 Exam by:: Barnes & NobleWeston,RN  Labs: Lab Results  Component Value Date   WBC 8.6 03/10/2014   HGB 12.2 03/10/2014   HCT 36.4 03/10/2014   MCV 84.3 03/10/2014   PLT 243 03/10/2014    Assessment / Plan: Spontaneous labor, progressing normally - may need to consider augmentation if not making further cervical hange at next check  Labor: Progressing normally Preeclampsia:  n/a Fetal Wellbeing:  Category I Pain Control:  Fentanyl I/D:  n/a Anticipated MOD:  NSVD  Shirlee LatchBacigalupo, Severa Jeremiah 03/10/2014, 6:28 AM

## 2014-03-10 NOTE — Anesthesia Preprocedure Evaluation (Signed)

## 2014-03-10 NOTE — Anesthesia Procedure Notes (Signed)
Epidural Patient location during procedure: OB Start time: 03/10/2014 10:11 AM  Staffing Anesthesiologist: Brayton CavesJACKSON, Nathifa Ritthaler Performed by: anesthesiologist   Preanesthetic Checklist Completed: patient identified, site marked, surgical consent, pre-op evaluation, timeout performed, IV checked, risks and benefits discussed and monitors and equipment checked  Epidural Patient position: sitting Prep: site prepped and draped and DuraPrep Patient monitoring: continuous pulse ox and blood pressure Approach: midline Location: L3-L4 Injection technique: LOR air  Needle:  Needle type: Tuohy  Needle gauge: 17 G Needle length: 9 cm and 9 Needle insertion depth: 5 cm cm Catheter type: closed end flexible Catheter size: 19 Gauge Catheter at skin depth: 10 cm Test dose: negative  Assessment Events: blood not aspirated, injection not painful, no injection resistance, negative IV test and no paresthesia  Additional Notes Patient identified.  Risk benefits discussed including failed block, incomplete pain control, headache, nerve damage, paralysis, blood pressure changes, nausea, vomiting, reactions to medication both toxic or allergic, and postpartum back pain.  Patient expressed understanding and wished to proceed.  All questions were answered.  Sterile technique used throughout procedure and epidural site dressed with sterile barrier dressing. No paresthesia or other complications noted.The patient did not experience any signs of intravascular injection such as tinnitus or metallic taste in mouth nor signs of intrathecal spread such as rapid motor block. Please see nursing notes for vital signs.

## 2014-03-10 NOTE — Progress Notes (Signed)
Doris Mays is a [age] y.o. G2P1001 at 2817w2d admitted for active labor, rupture of membranes  Subjective: Pt comfortable with IV pain medication  Objective: BP 117/72 mmHg  Pulse 65  Temp(Src) 97.7 F (36.5 C) (Oral)  Resp 18  Ht 5\' 4"  (1.626 m)  Wt 226 lb (102.513 kg)  BMI 38.77 kg/m2  LMP 06/01/2013      FHT:  FHR: 130 bpm, variability: moderate,  accelerations:  Present,  decelerations:  Absent UC:  Irregular q3-7 minutes SVE:   Dilation: 7 Effacement (%): 90 Station: -1 Exam by:: Dr Loreta AveAcosta  Labs: Lab Results  Component Value Date   WBC 8.6 03/10/2014   HGB 12.2 03/10/2014   HCT 36.4 03/10/2014   MCV 84.3 03/10/2014   PLT 243 03/10/2014    Assessment / Plan: Spontaneous labor, progressing normally, continue with TOLAC  Labor: Progressing normally, no pitocin indicated at this time as pt has made good cervical change despite inadequate contraction pattern Preeclampsia:  n/a Fetal Wellbeing:  Category I Pain Control:  Fentanyl I/D:  n/a Anticipated MOD:  NSVD  [name] ROCIO 03/10/2014, 9:56 AM

## 2014-03-10 NOTE — Progress Notes (Addendum)
Doris Mays is a 28 y.o. G2P1001 at 7113w2d admitted for active labor, rupture of membranes  Subjective: Pt comfortable with IV pain medication  Objective: BP 115/76 mmHg  Pulse 90  Temp(Src) 97.7 F (36.5 C) (Oral)  Resp 18  Ht 5\' 4"  (1.626 m)  Wt 102.513 kg (226 lb)  BMI 38.77 kg/m2  LMP 06/01/2013      FHT:  FHR: 130 bpm, variability: moderate,  accelerations:  Present,  decelerations:  Absent UC:   regular, every 3 minutes SVE:   Dilation: 4.5 Effacement (%): 90 Station: -2 Exam by:: L Connors RN  Labs: Lab Results  Component Value Date   WBC 8.6 03/10/2014   HGB 12.2 03/10/2014   HCT 36.4 03/10/2014   MCV 84.3 03/10/2014   PLT 243 03/10/2014    Assessment / Plan: Spontaneous labor, progressing normally  Labor: Progressing normally Preeclampsia:  n/a Fetal Wellbeing:  Category I Pain Control:  Fentanyl I/D:  n/a Anticipated MOD:  NSVD  LEFTWICH-KIRBY, Eiko Mcgowen 03/10/2014, 7:17 AM

## 2014-03-11 LAB — CBC
HEMATOCRIT: 34.1 % — AB (ref 36.0–46.0)
Hemoglobin: 11.1 g/dL — ABNORMAL LOW (ref 12.0–15.0)
MCH: 27.6 pg (ref 26.0–34.0)
MCHC: 32.6 g/dL (ref 30.0–36.0)
MCV: 84.8 fL (ref 78.0–100.0)
Platelets: 220 10*3/uL (ref 150–400)
RBC: 4.02 MIL/uL (ref 3.87–5.11)
RDW: 14.7 % (ref 11.5–15.5)
WBC: 15.1 10*3/uL — ABNORMAL HIGH (ref 4.0–10.5)

## 2014-03-11 LAB — CCBB MATERNAL DONOR DRAW

## 2014-03-11 NOTE — Progress Notes (Signed)
UR chart review completed.  

## 2014-03-11 NOTE — Progress Notes (Signed)
Post Partum Day #1 Subjective: no complaints, up ad lib and tolerating PO; breastfeeding going well; desires POPs  Objective: Blood pressure 124/74, pulse 86, temperature 98.1 F (36.7 C), temperature source Oral, resp. rate 17, height 5\' 4"  (1.626 m), weight 102.513 kg (226 lb), last menstrual period 06/01/2013, SpO2 98 %, unknown if currently breastfeeding.  Physical Exam:  General: alert, cooperative and no distress Lochia: appropriate Uterine Fundus: firm DVT Evaluation: No evidence of DVT seen on physical exam.   Recent Labs  03/10/14 0425 03/11/14 0610  HGB 12.2 11.1*  HCT 36.4 34.1*    Assessment/Plan: Plan for discharge tomorrow   LOS: 1 day   Cam HaiSHAW, KIMBERLY CNM 03/11/2014, 9:27 AM

## 2014-03-11 NOTE — Anesthesia Postprocedure Evaluation (Signed)
Anesthesia Post Note  Patient: Doris Mays  Procedure(s) Performed: * No procedures listed *  Anesthesia type: Epidural  Patient location: Mother/Baby  Post pain: Pain level controlled  Post assessment: Post-op Vital signs reviewed  Last Vitals:  Filed Vitals:   03/11/14 0557  BP: 124/74  Pulse: 86  Temp: 36.7 C  Resp: 17    Post vital signs: Reviewed  Level of consciousness:alert  Complications: No apparent anesthesia complications

## 2014-03-11 NOTE — Lactation Note (Signed)
This note was copied from the chart of Doris Canadaova Couser. Lactation Consultation Note  Patient Name: Doris Eual Finesova Volpe NUUVO'ZToday's Date: 03/11/2014 Reason for consult: Initial assessment  Baby 20 hours of life. Mom very sleepy when LC came in room. Mom fell asleep while she and LC speaking. Enc mom to call out when awake. Baby asleep in crib. Mom states baby at well within last hour. Mom does state that she has been having some difficulty getting baby to latch deeply. [facility], aware of [facility], community resources, and [facility] after [facility]. Maternal Data Does the patient have breastfeeding experience prior to this delivery?: Yes  Feeding Feeding Type: Breast Fed Length of feed: 20 min  LATCH Score/Interventions                      Lactation Tools Discussed/Used     Consult Status Consult Status: Follow-up Date: 03/11/14 Follow-up type: In-patient    Geralynn OchsWILLIARD, [name] 03/11/2014, 1:30 PM

## 2014-03-12 MED ORDER — NORETHINDRONE 0.35 MG PO TABS: 1.0000 | ORAL_TABLET | Freq: Every day | ORAL | Status: DC

## 2014-03-12 MED ORDER — IBUPROFEN 600 MG PO TABS: 600.0000 mg | ORAL_TABLET | Freq: Four times a day (QID) | ORAL | Status: DC

## 2014-03-12 NOTE — Discharge Instructions (Signed)
Vaginal Delivery, Care After °Refer to this sheet in the next few weeks. These discharge instructions provide you with information on caring for yourself after delivery. Your caregiver may also give you specific instructions. Your treatment has been planned according to the most current medical practices available, but problems sometimes occur. Call your caregiver if you have any problems or questions after you go home. °HOME CARE INSTRUCTIONS °· Take over-the-counter or prescription medicines only as directed by your caregiver or pharmacist. °· Do not drink alcohol, especially if you are breastfeeding or taking medicine to relieve pain. °· Do not chew or smoke tobacco. °· Do not use illegal drugs. °· Continue to use good perineal care. Good perineal care includes: °· Wiping your perineum from front to back. °· Keeping your perineum clean. °· Do not use tampons or douche until your caregiver says it is okay. °· Shower, wash your hair, and take tub baths as directed by your caregiver. °· Wear a well-fitting bra that provides breast support. °· Eat healthy foods. °· Drink enough fluids to keep your urine clear or pale yellow. °· Eat high-fiber foods such as whole grain cereals and breads, brown rice, beans, and fresh fruits and vegetables every day. These foods may help prevent or relieve constipation. °· Follow your caregiver's recommendations regarding resumption of activities such as climbing stairs, driving, lifting, exercising, or traveling. °· Talk to your caregiver about resuming sexual activities. Resumption of sexual activities is dependent upon your risk of infection, your rate of healing, and your comfort and desire to resume sexual activity. °· Try to have someone help you with your household activities and your newborn for at least a few days after you leave the hospital. °· Rest as much as possible. Try to rest or take a nap when your newborn is sleeping. °· Increase your activities gradually. °· Keep  all of your scheduled postpartum appointments. It is very important to keep your scheduled follow-up appointments. At these appointments, your caregiver will be checking to make sure that you are healing physically and emotionally. °SEEK MEDICAL CARE IF:  °· You are passing large clots from your vagina. Save any clots to show your caregiver. °· You have a foul smelling discharge from your vagina. °· You have trouble urinating. °· You are urinating frequently. °· You have pain when you urinate. °· You have a change in your bowel movements. °· You have increasing redness, pain, or swelling near your vaginal incision (episiotomy) or vaginal tear. °· You have pus draining from your episiotomy or vaginal tear. °· Your episiotomy or vaginal tear is separating. °· You have painful, hard, or reddened breasts. °· You have a severe headache. °· You have blurred vision or see spots. °· You feel sad or depressed. °· You have thoughts of hurting yourself or your newborn. °· You have questions about your care, the care of your newborn, or medicines. °· You are dizzy or light-headed. °· You have a rash. °· You have nausea or vomiting. °· You were breastfeeding and have not had a menstrual period within 12 weeks after you stopped breastfeeding. °· You are not breastfeeding and have not had a menstrual period by the 12th week after delivery. °· You have a fever. °SEEK IMMEDIATE MEDICAL CARE IF:  °· You have persistent pain. °· You have chest pain. °· You have shortness of breath. °· You faint. °· You have leg pain. °· You have stomach pain. °· Your vaginal bleeding saturates two or more sanitary pads   in 1 hour. °MAKE SURE YOU:  °· Understand these instructions. °· Will watch your condition. °· Will get help right away if you are not doing well or get worse. °Document Released: 03/09/2000 Document Revised: 07/27/2013 Document Reviewed: 11/07/2011 °Exit[employer]® Patient Information ©2015 [employer]. This information is not intended to  replace advice given to you by your health care provider. Make sure you discuss any questions you have with your health care provider. ° °Breastfeeding °Deciding to breastfeed is one of the best choices you can make for you and your baby. A change in hormones during pregnancy causes your breast tissue to grow and increases the number and size of your milk ducts. These hormones also allow proteins, sugars, and fats from your blood supply to make breast milk in your milk-producing glands. Hormones prevent breast milk from being released before your baby is born as well as prompt milk flow after birth. Once breastfeeding has begun, thoughts of your baby, as well as his or her sucking or crying, can stimulate the release of milk from your milk-producing glands.  °BENEFITS OF BREASTFEEDING °For Your Baby °· Your first milk (colostrum) helps your baby's digestive system function better.   °· There are antibodies in your milk that help your baby fight off infections.   °· Your baby has a lower incidence of asthma, allergies, and sudden infant death syndrome.   °· The nutrients in breast milk are better for your baby than infant formulas and are designed uniquely for your baby's needs.   °· Breast milk improves your baby's brain development.   °· Your baby is less likely to develop other conditions, such as childhood obesity, asthma, or type 2 diabetes mellitus.   °For You  °· Breastfeeding helps to create a very special bond between you and your baby.   °· Breastfeeding is convenient. Breast milk is always available at the correct temperature and costs nothing.   °· Breastfeeding helps to burn calories and helps you lose the weight gained during pregnancy.   °· Breastfeeding makes your uterus contract to its prepregnancy size faster and slows bleeding (lochia) after you give birth.   °· Breastfeeding helps to lower your risk of developing type 2 diabetes mellitus, osteoporosis, and breast or ovarian cancer later in  life. °SIGNS THAT YOUR BABY IS HUNGRY °Early Signs of Hunger  °· Increased alertness or activity. °· Stretching. °· Movement of the head from side to side. °· Movement of the head and opening of the mouth when the corner of the mouth or cheek is stroked (rooting). °· Increased sucking sounds, smacking lips, cooing, sighing, or squeaking. °· Hand-to-mouth movements. °· Increased sucking of fingers or hands. °Late Signs of Hunger °· Fussing. °· Intermittent crying. °Extreme Signs of Hunger °Signs of extreme hunger will require calming and consoling before your baby will be able to breastfeed successfully. Do not wait for the following signs of extreme hunger to occur before you initiate breastfeeding:   °· Restlessness. °· A loud, strong cry. °·  Screaming. °BREASTFEEDING BASICS °Breastfeeding Initiation °· Find a comfortable place to sit or lie down, with your neck and back well supported. °· Place a pillow or rolled up blanket under your baby to bring him or her to the level of your breast (if you are seated). Nursing pillows are specially designed to help support your arms and your baby while you breastfeed. °· Make sure that your baby's abdomen is facing your abdomen.   °· Gently massage your breast. With your fingertips, massage from your chest wall toward your nipple in a   circular motion. This encourages milk flow. You may need to continue this action during the feeding if your milk flows slowly. °· Support your breast with 4 fingers underneath and your thumb above your nipple. Make sure your fingers are well away from your nipple and your baby's mouth.   °· Stroke your baby's lips gently with your finger or nipple.   °· When your baby's mouth is open wide enough, quickly bring your baby to your breast, placing your entire nipple and as much of the colored area around your nipple (areola) as possible into your baby's mouth.   °¨ More areola should be visible above your baby's upper lip than below the lower lip.    °¨ Your baby's tongue should be between his or her lower gum and your breast.   °· Ensure that your baby's mouth is correctly positioned around your nipple (latched). Your baby's lips should create a seal on your breast and be turned out (everted). °· It is common for your baby to suck about 2-3 minutes in order to start the flow of breast milk. °Latching °Teaching your baby how to latch on to your breast properly is very important. An improper latch can cause nipple pain and decreased milk supply for you and poor weight gain in your baby. Also, if your baby is not latched onto your nipple properly, he or she may swallow some air during feeding. This can make your baby fussy. Burping your baby when you switch breasts during the feeding can help to get rid of the air. However, teaching your baby to latch on properly is still the best way to prevent fussiness from swallowing air while breastfeeding. °Signs that your baby has successfully latched on to your nipple:    °· Silent tugging or silent sucking, without causing you pain.   °· Swallowing heard between every 3-4 sucks.   °·  Muscle movement above and in front of his or her ears while sucking.   °Signs that your baby has not successfully latched on to nipple:  °· Sucking sounds or smacking sounds from your baby while breastfeeding. °· Nipple pain. °If you think your baby has not latched on correctly, slip your finger into the corner of your baby's mouth to break the suction and place it between your baby's gums. Attempt breastfeeding initiation again. °Signs of Successful Breastfeeding °Signs from your baby:   °· A gradual decrease in the number of sucks or complete cessation of sucking.   °· Falling asleep.   °· Relaxation of his or her body.   °· Retention of a small amount of milk in his or her mouth.   °· Letting go of your breast by himself or herself. °Signs from you: °· Breasts that have increased in firmness, weight, and size 1-3 hours after feeding.    °· Breasts that are softer immediately after breastfeeding. °· Increased milk volume, as well as a change in milk consistency and color by the fifth day of breastfeeding.   °· Nipples that are not sore, cracked, or bleeding. ° °Signs That Your Baby is Getting Enough Milk °· Wetting at least 3 diapers in a 24-hour period. The urine should be clear and pale yellow by age 5 days. °· At least 3 stools in a 24-hour period by age 5 days. The stool should be soft and yellow. °· At least 3 stools in a 24-hour period by age 7 days. The stool should be seedy and yellow. °· No loss of weight greater than 10% of birth weight during the first 3 days of age. °· Average   weight gain of 4-7 ounces (113-198 g) per week after age 4 days. °· Consistent daily weight gain by age 5 days, without weight loss after the age of 2 weeks. °After a feeding, your baby may spit up a small amount. This is common. °BREASTFEEDING FREQUENCY AND DURATION °Frequent feeding will help you make more milk and can prevent sore nipples and breast engorgement. Breastfeed when you feel the need to reduce the fullness of your breasts or when your baby shows signs of hunger. This is called "breastfeeding on demand." Avoid introducing a pacifier to your baby while you are working to establish breastfeeding (the first 4-6 weeks after your baby is born). After this time you may choose to use a pacifier. Research has shown that pacifier use during the first year of a baby's life decreases the risk of sudden infant death syndrome (SIDS). °Allow your baby to feed on each breast as long as he or she wants. Breastfeed until your baby is finished feeding. When your baby unlatches or falls asleep while feeding from the first breast, offer the second breast. Because newborns are often sleepy in the first few weeks of life, you may need to awaken your baby to get him or her to feed. °Breastfeeding times will vary from baby to baby. However, the following rules can serve as  a guide to help you ensure that your baby is properly fed: °· Newborns (babies 4 weeks of age or younger) may breastfeed every 1-3 hours. °· Newborns should not go longer than 3 hours during the day or 5 hours during the night without breastfeeding. °· You should breastfeed your baby a minimum of 8 times in a 24-hour period until you begin to introduce solid foods to your baby at around 6 months of age. °BREAST MILK PUMPING °Pumping and storing breast milk allows you to ensure that your baby is exclusively fed your breast milk, even at times when you are unable to breastfeed. This is especially important if you are going back to work while you are still breastfeeding or when you are not able to be present during feedings. Your lactation consultant can give you guidelines on how long it is safe to store breast milk.  °A breast pump is a machine that allows you to pump milk from your breast into a sterile bottle. The pumped breast milk can then be stored in a refrigerator or freezer. Some breast pumps are operated by hand, while others use electricity. Ask your lactation consultant which type will work best for you. Breast pumps can be purchased, but some hospitals and breastfeeding support groups lease breast pumps on a monthly basis. A lactation consultant can teach you how to hand express breast milk, if you prefer not to use a pump.  °CARING FOR YOUR BREASTS WHILE YOU BREASTFEED °Nipples can become dry, cracked, and sore while breastfeeding. The following recommendations can help keep your breasts moisturized and healthy: °· Avoid using soap on your nipples.   °· Wear a supportive bra. Although not required, special nursing bras and tank tops are designed to allow access to your breasts for breastfeeding without taking off your entire bra or top. Avoid wearing underwire-style bras or extremely tight bras. °· Air dry your nipples for 3-4 minutes after each feeding.   °· Use only cotton bra pads to absorb leaked  breast milk. Leaking of breast milk between feedings is normal.   °· Use lanolin on your nipples after breastfeeding. Lanolin helps to maintain your skin's normal moisture barrier. If   you use pure lanolin, you do not need to wash it off before feeding your baby again. Pure lanolin is not toxic to your baby. You may also hand express a few drops of breast milk and gently massage that milk into your nipples and allow the milk to air dry. °In the first few weeks after giving birth, some women experience extremely full breasts (engorgement). Engorgement can make your breasts feel heavy, warm, and tender to the touch. Engorgement peaks within 3-5 days after you give birth. The following recommendations can help ease engorgement: °· Completely empty your breasts while breastfeeding or pumping. You may want to start by applying warm, moist heat (in the shower or with warm water-soaked hand towels) just before feeding or pumping. This increases circulation and helps the milk flow. If your baby does not completely empty your breasts while breastfeeding, pump any extra milk after he or she is finished. °· Wear a snug bra (nursing or regular) or tank top for 1-2 days to signal your body to slightly decrease milk production. °· Apply ice packs to your breasts, unless this is too uncomfortable for you. °· Make sure that your baby is latched on and positioned properly while breastfeeding. °If engorgement persists after 48 hours of following these recommendations, contact your health care provider or a lactation consultant. °OVERALL HEALTH CARE RECOMMENDATIONS WHILE BREASTFEEDING °· Eat healthy foods. Alternate between meals and snacks, eating 3 of each per day. Because what you eat affects your breast milk, some of the foods may make your baby more irritable than usual. Avoid eating these foods if you are sure that they are negatively affecting your baby. °· Drink milk, fruit juice, and water to satisfy your thirst (about 10  glasses a day).   °· Rest often, relax, and continue to take your prenatal vitamins to prevent fatigue, stress, and anemia. °· Continue breast self-awareness checks. °· Avoid chewing and smoking tobacco. °· Avoid alcohol and drug use. °Some medicines that may be harmful to your baby can pass through breast milk. It is important to ask your health care provider before taking any medicine, including all over-the-counter and prescription medicine as well as vitamin and herbal supplements. °It is possible to become pregnant while breastfeeding. If birth control is desired, ask your health care provider about options that will be safe for your baby. °SEEK MEDICAL CARE IF:  °· You feel like you want to stop breastfeeding or have become frustrated with breastfeeding. °· You have painful breasts or nipples. °· Your nipples are cracked or bleeding. °· Your breasts are red, tender, or warm. °· You have a swollen area on either breast. °· You have a fever or chills. °· You have nausea or vomiting. °· You have drainage other than breast milk from your nipples. °· Your breasts do not become full before feedings by the fifth day after you give birth. °· You feel sad and depressed. °· Your baby is too sleepy to eat well. °· Your baby is having trouble sleeping.   °· Your baby is wetting less than 3 diapers in a 24-hour period. °· Your baby has less than 3 stools in a 24-hour period. °· Your baby's skin or the white part of his or her eyes becomes yellow.   °· Your baby is not gaining weight by 5 days of age. °SEEK IMMEDIATE MEDICAL CARE IF:  °· Your baby is overly tired (lethargic) and does not want to wake up and feed. °· Your baby develops an unexplained fever. °Document   Released: 03/12/2005 Document Revised: 03/17/2013 Document Reviewed: 09/03/2012 °[employer]® Patient Information ©2015 [employer]. This information is not intended to replace advice given to you by your health care provider. Make sure you discuss any questions  you have with your health care provider. ° °

## 2014-03-12 NOTE — Discharge Summary (Signed)
Obstetric Discharge Summary Reason for Admission: rupture of membranes Prenatal Procedures: NST and ultrasound Intrapartum Procedures: spontaneous vaginal delivery Postpartum Procedures: none Complications-Operative and Postpartum: 2 degree perineal laceration and small labial clitoral laceration Eating, drinking, voiding, ambulating well.  +flatus.  Lochia and pain wnl.  Denies dizziness, lightheadedness, or sob. No complaints.   Hospital Course: Doris Mays is a 28 y.o. 1002P2002 female admited at 540.2wks. She presented with spontaneous rupture of membranes and was admitted to birthing suites.  She had an epidural for labor pain management.  Her delivery was as follows: Delivery Note At 4:50 PM a viable female was delivered via VBAC, Spontaneous (Presentation: Right Occiput Anterior). APGAR: 9, 9; weight 9 lb 2.2 oz (4145 g). Tight shoulders, not true dystocia but were tight due to VERY poor maternal effort, patient climbing up in bed. Placenta status: Intact, Spontaneous. Cord: 3 vessels with the following complications: None.  Anesthesia: Epidural  Episiotomy:  Lacerations: 2nd degree;Perineal; small labial clitoral laceration sutured with 3.0 monocryl Suture Repair: 3.0 vicryl rapide Est. Blood Loss (mL): 200 Mom to postpartum. Baby to Couplet care / Skin to Skin   Her pp course has been uncomplicated.  By PPD#2 she is doing well and is deemed to have received the full benefit of her hospital stay and wishes to be discharged home.  Filed Vitals:   03/12/14 0550  BP: 125/78  Pulse: 87  Temp: 98 F (36.7 C)  Resp: 18   H/H: Lab Results  Component Value Date/Time   HGB 11.1* 03/11/2014 06:10 AM   HCT 34.1* 03/11/2014 06:10 AM    Physical Exam: General: alert, cooperative and no distress Abdomen/Uterine Fundus: Appropriately tender, non-distended, FF @ U--1 Incision: with some slight edema noted.  No signs of infections noted. Lochia: appropriate Extremities: No evidence  of DVT seen on physical exam. Negative Homan's sign, no cords, calf tenderness, or significant calf/ankle edema   Discharge Diagnoses: Term Pregnancy-delivered via VBAC  Discharge Information: Date: 03/12/2014 Activity: pelvic rest Diet: routine  Medications: Ibuprofen Breast feeding: Yes Contraception: POPs Circumcision: declined Condition: stable Instructions: refer to handout Discharge to: home  Infant: Home with mother  Follow-up Information    [facility] Schedule an appointment as soon as possible for a visit in 4 weeks.   Why:  schedule postpartum appt for 4-6 weeks   Contact information:   [address] [place] [id number] [phone]      ADAMS,SHNIQUAL SHWON, Student [place] 03/12/2014,7:30 AM    I have seen and examined this patient and agree the above assessment. [name],Soren Pigman 03/12/2014 8:02 PM

## 2014-03-12 NOTE — Lactation Note (Signed)
This note was copied from the chart of [facility] [place]. Lactation Consultation Note   Follow up consult with this mom and baby, now 5541 hours old. Mom was breast feeding in a semi side lying position when I walked into the room. The baby appeared to be latched deeply, but mom was not in a comfortable position. Mom agreed to [facility] the baby  and reposition so tthe feeding. The baby latched deeply, and mom reported this latch comfortable. i showed mom how a deep latch appears. Her nipple was pinched after her latch in side lying, and I explained how her nipple should be  Free in baby's mouth, and not be pinched in appearance. Breast care reviewed, and mom knows to call for questions/concerns.  Patient Name: Doris Mays XBJYN'WToday's Date: 03/12/2014 Reason for consult: Follow-up assessment   Maternal Data    Feeding Feeding Type: Breast Fed Length of feed: 5 min  LATCH Score/Interventions Latch: Grasps breast easily, tongue down, lips flanged, rhythmical sucking. Intervention(s): Adjust position;Assist with latch;Breast compression  Audible Swallowing: A few with stimulation  Type of Nipple: Everted at rest and after stimulation  Comfort (Breast/Nipple): Soft / non-tender     Hold (Positioning): Assistance needed to correctly position infant at breast and maintain latch. Intervention(s): Breastfeeding basics reviewed;Support Pillows;Position options;Skin to skin  LATCH Score: 8  Lactation Tools Discussed/Used     Consult Status Consult Status: Complete Follow-up type: Call as needed    Alfred LevinsLee, Emmaclaire Switala Anne 03/12/2014, 9:54 AM

## 2014-04-15 ENCOUNTER — Ambulatory Visit (INDEPENDENT_AMBULATORY_CARE_PROVIDER_SITE_OTHER): Payer: Medicaid Other | Admitting: Family Medicine

## 2014-04-15 ENCOUNTER — Encounter: Payer: Self-pay | Admitting: Family Medicine

## 2014-04-15 NOTE — Progress Notes (Signed)
  Subjective:     Doris Mays is a 29 y.o. female who presents for a postpartum visit. She is 5 weeks postpartum following a spontaneous vaginal delivery. I have fully reviewed the prenatal and intrapartum course. The delivery was at 543w3d gestational weeks. Outcome: spontaneous vaginal delivery. Anesthesia: epidural. Postpartum course has been normal. Baby's course has been normal. Baby is feeding by breast. Bleeding staining only. Bowel function is normal. Bladder function is normal. Patient is not sexually active. Contraception method is oral progesterone-only contraceptive. Postpartum depression screening: negative.  The following portions of the patient's history were reviewed and updated as appropriate: allergies, current medications, past family history, past medical history, past social history, past surgical history and problem list.  Review of Systems Pertinent items are noted in HPI.   Objective:    BP 122/75 mmHg  Pulse 75  Temp(Src) 97.8 F (36.6 C)  Wt 199 lb 14.4 oz (90.674 kg)  Breastfeeding? Yes  General:  alert, cooperative and no distress  Lungs: clear to auscultation bilaterally  Heart:  regular rate and rhythm, S1, S2 normal, no murmur, click, rub or gallop  Abdomen: soft, non-tender; bowel sounds normal; no masses,  no organomegaly   Vulva:  normal  Vagina: not evaluated        Assessment:     Normal postpartum exam. Pap smear not done at today's visit.   Plan:    1. Contraception: oral progesterone-only contraceptive 2. Follow up in: 1 year or as needed.

## 2014-08-29 IMAGING — US US OB COMP LESS 14 WK
1 series · 14 of 28 positions shown · non-contrast
Comparison: None.

CLINICAL DATA: Assess dating/viability. LMP 05/31/2013. By LMP the
Obesity.

EXAM:
OBSTETRIC <14 WK ULTRASOUND
TECHNIQUE: Transabdominal ultrasound was performed for evaluation of the
gestation as well as the maternal uterus and adnexal regions.

[Series 1: us ob comp less 14 wks · 14 of 47 slices shown]
[im 2/47]
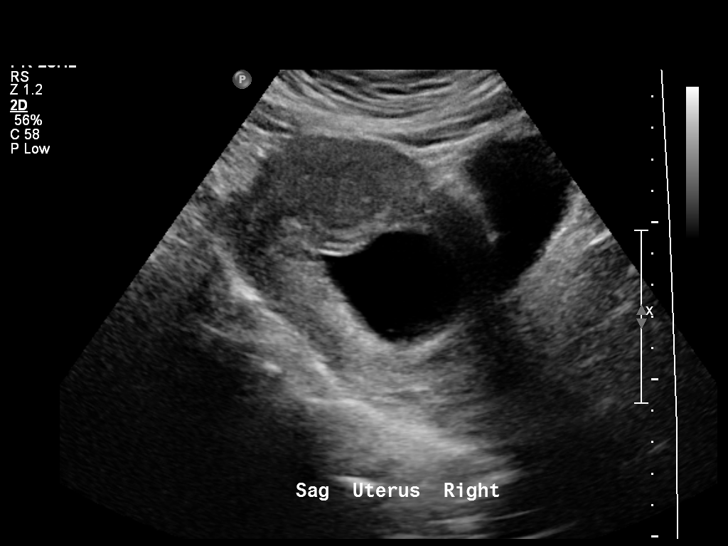
[im 6/47]
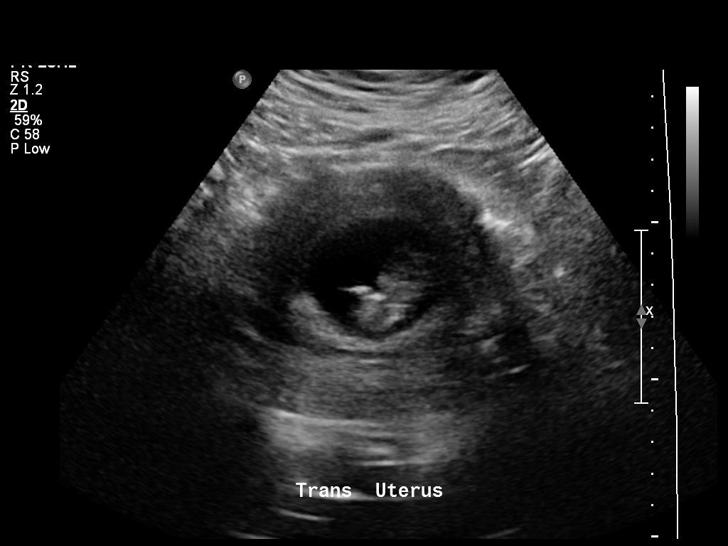
[im 9/47]
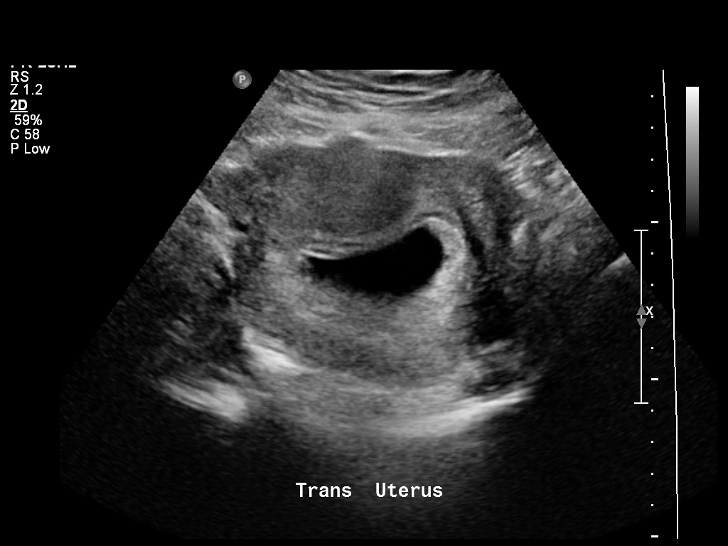
[im 12/47]
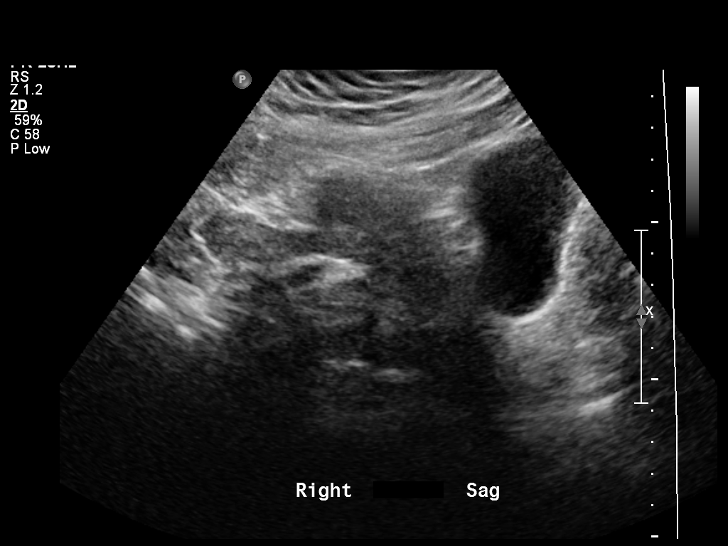
[im 16/47]
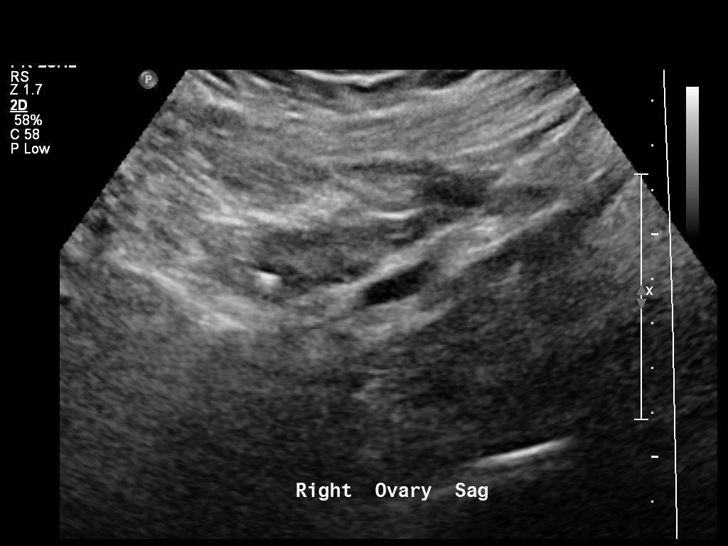
[im 19/47]
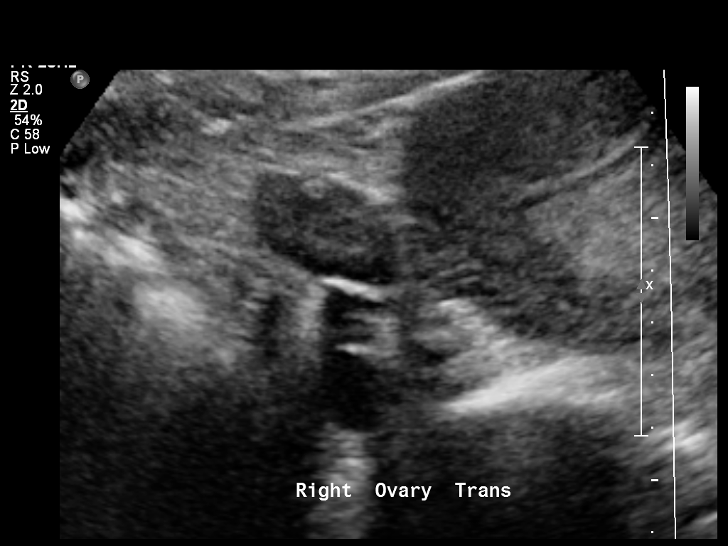
[im 23/47]
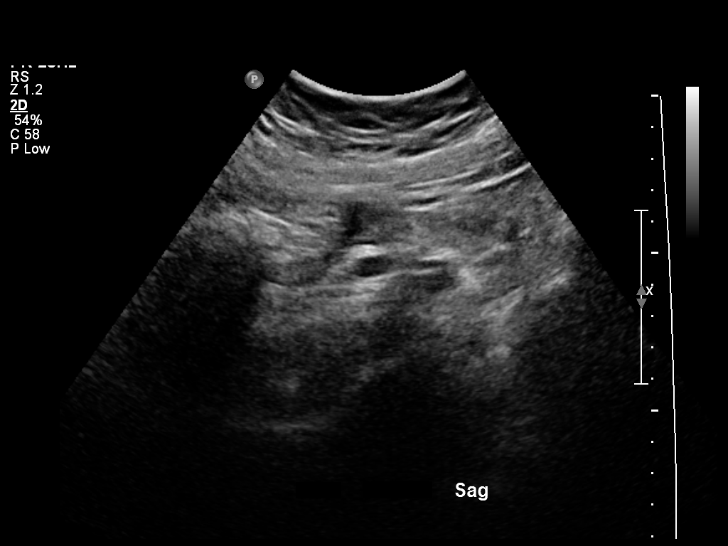
[im 26/47]
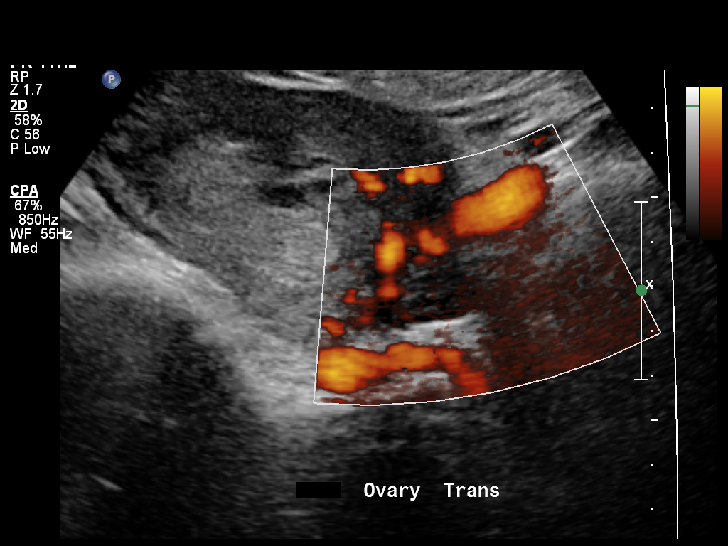
[im 29/47]
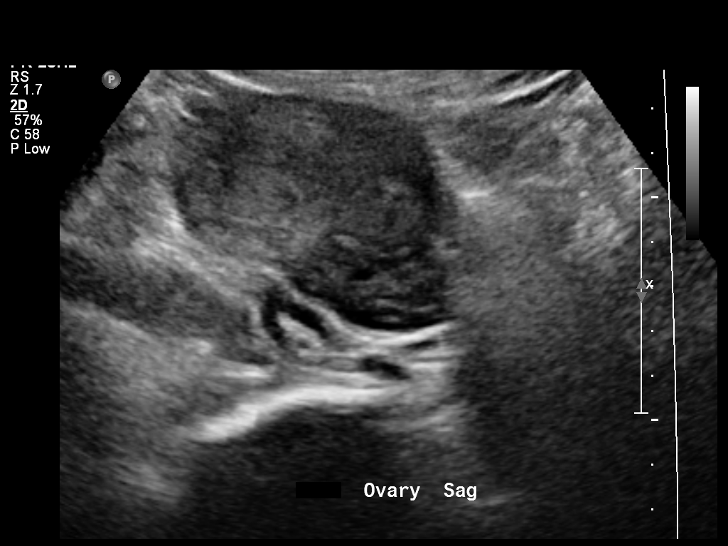
[im 33/47]
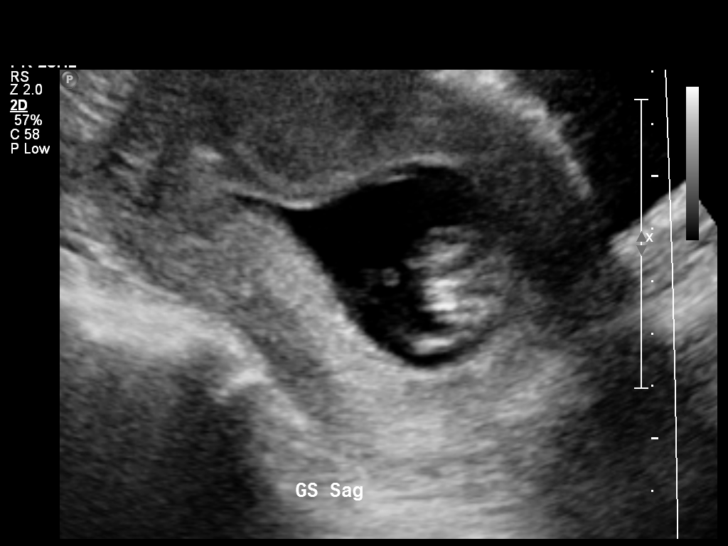
[im 36/47]
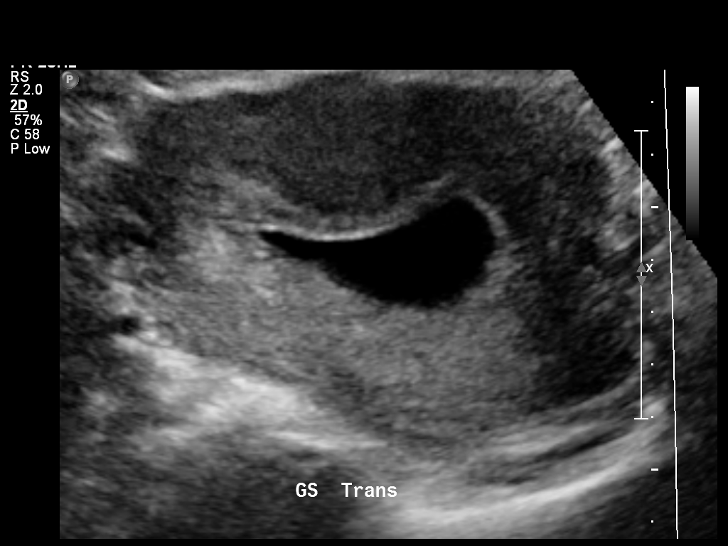
[im 40/47]
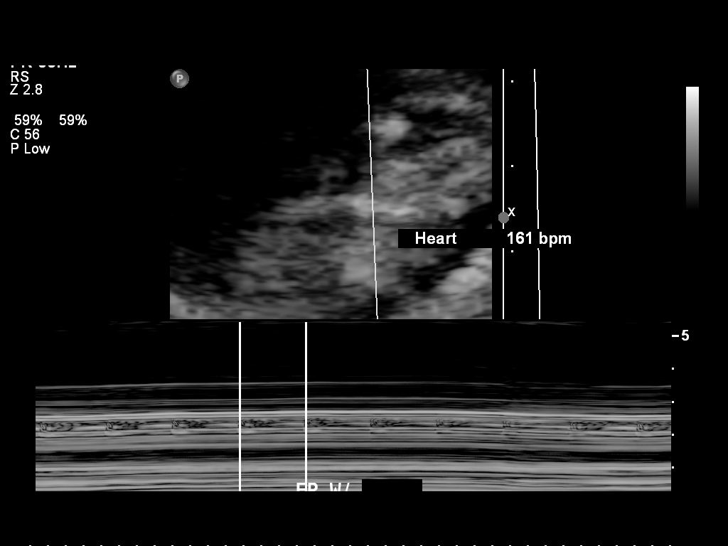
[im 43/47]
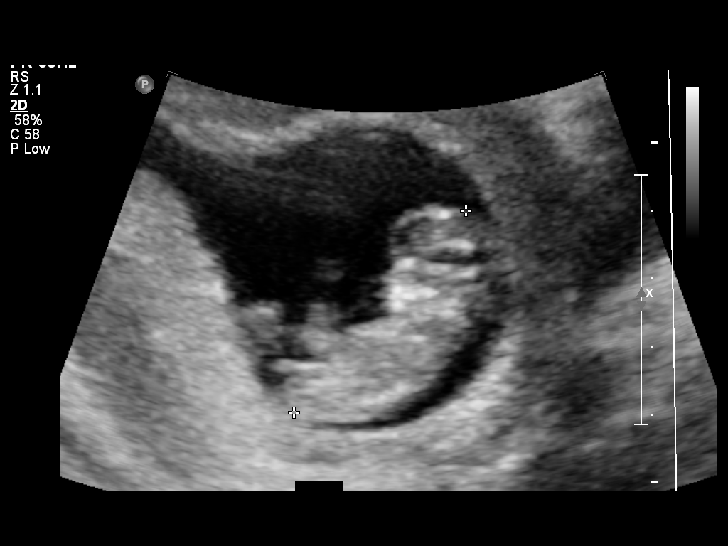
[im 47/47]
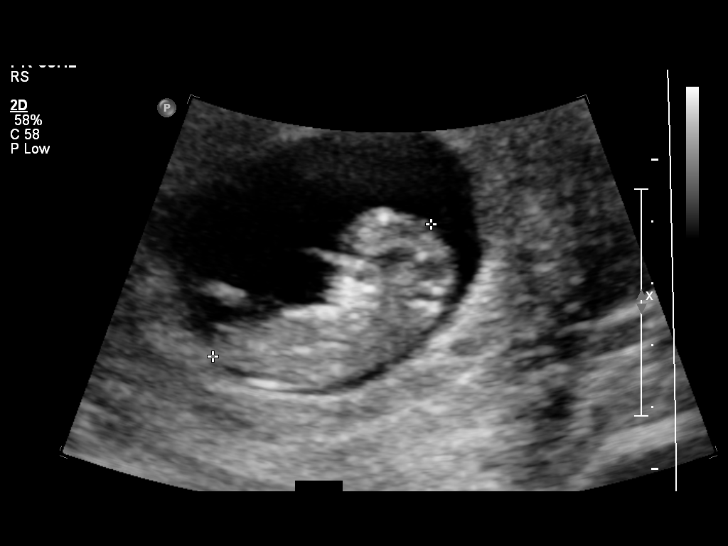

[14 of 28 positions shown; findings below may reference images not displayed]

FINDINGS: Intrauterine gestational sac: Present

Yolk sac:  Present

Embryo:  Present

Cardiac Activity: Present

Heart Rate: 161 bpm

CRL:   40.6  mm   11 w 0 d                  US EDC: 03/02/2014

Maternal uterus/adnexae: Normal appearing ovaries. No subchorionic
hemorrhage. No free pelvic fluid.
IMPRESSION: 1. Single living intrauterine embryo correlates within 5 days of
clinical dating.
2. EDC by LMP is 03/07/2014.
3. No subchorionic hemorrhage.

## 2014-11-07 IMAGING — US US OB COMP +14 WK
1 series · 12 of 28 positions shown · non-contrast
Comparison: none

[Series 1: us ob comp +14 wk · 96 acquisitions, 12 frames shown]
[im 4/96]
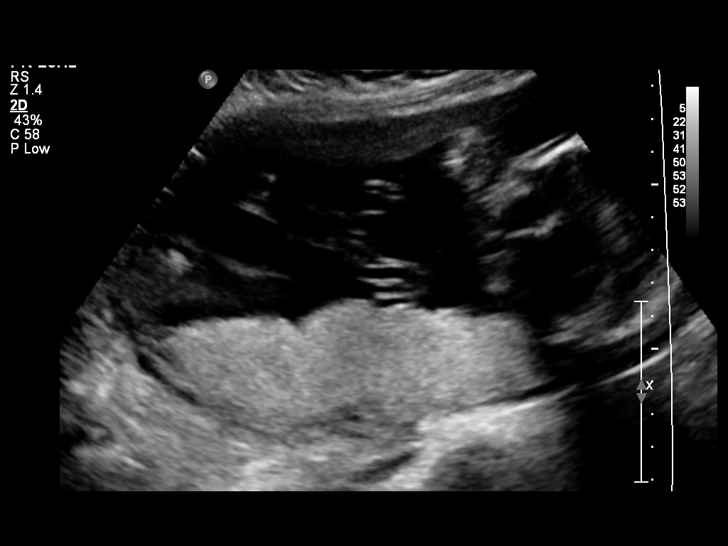
[im 11/96]
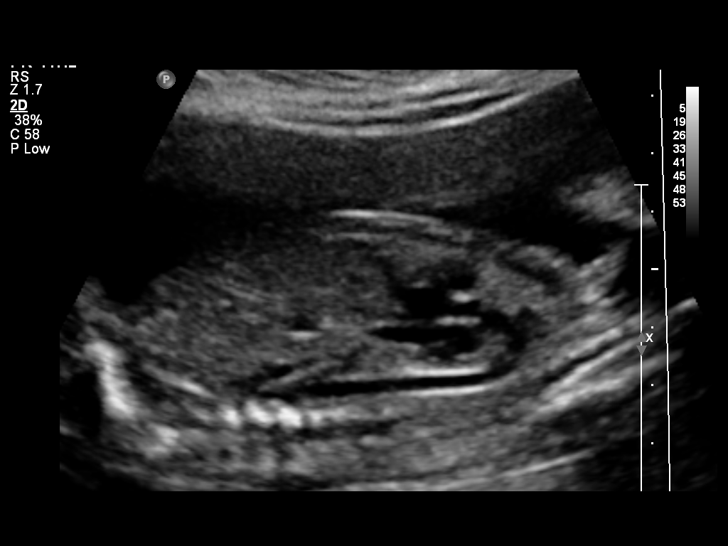
[im 18/96]
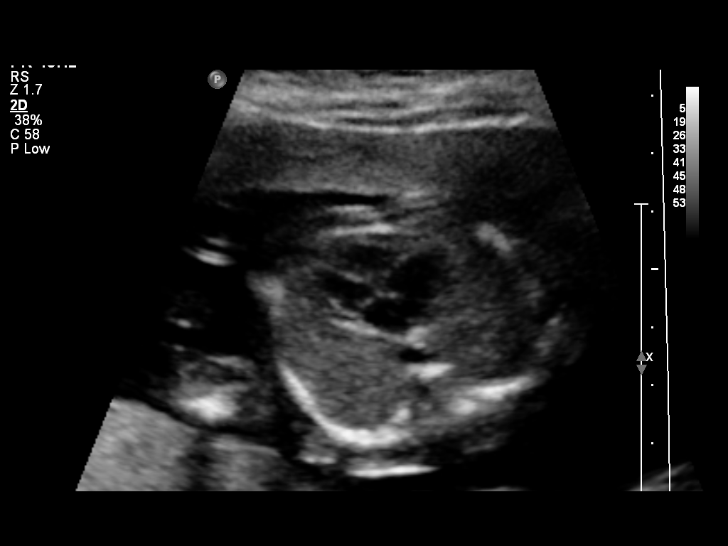
[im 29/96]
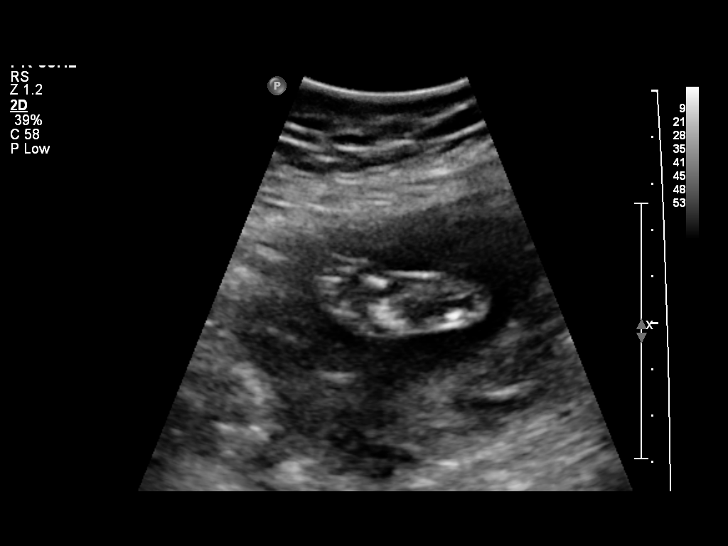
[im 36/96]
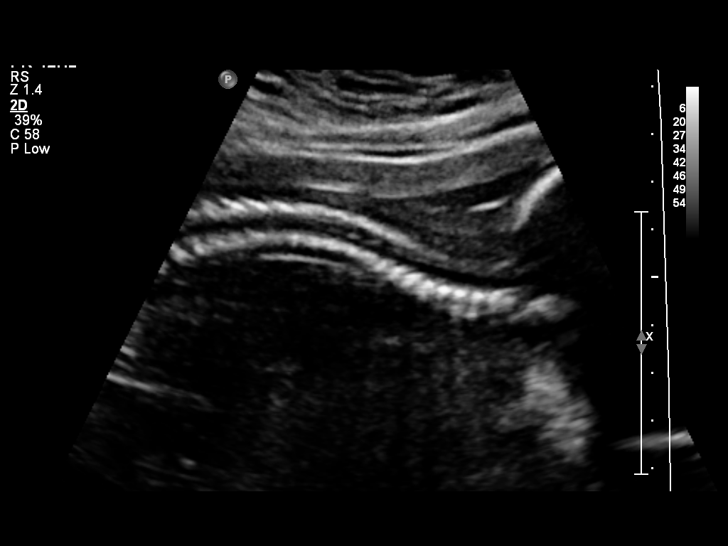
[im 43/96]
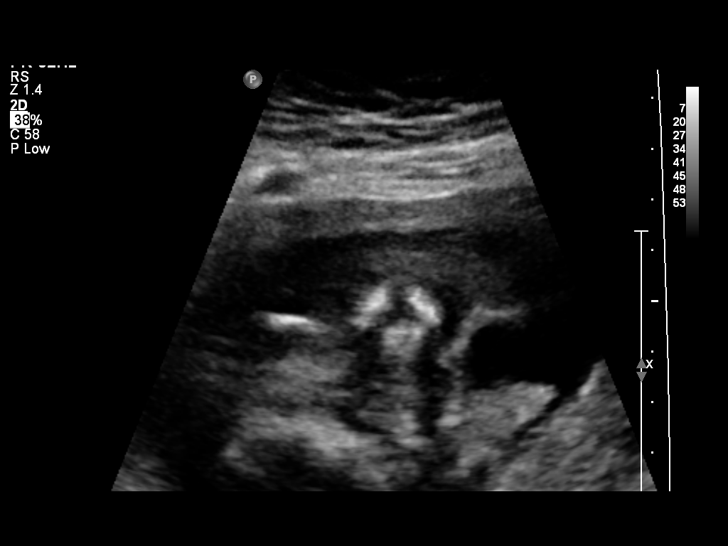
[im 53/96]
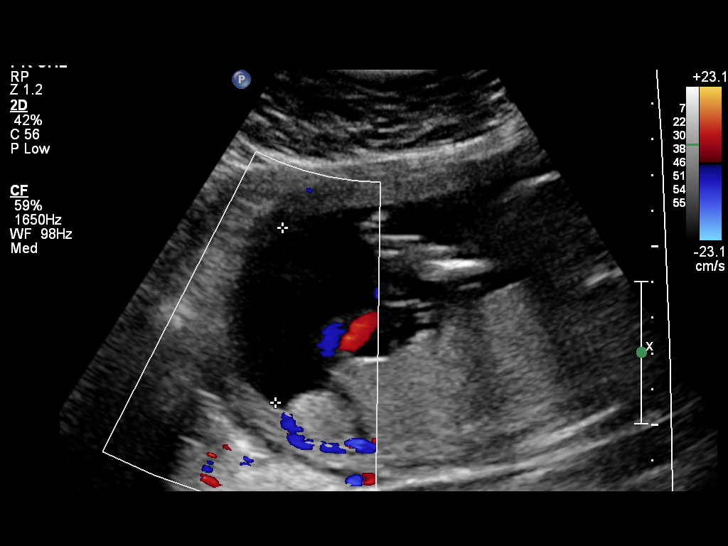
[im 60/96]
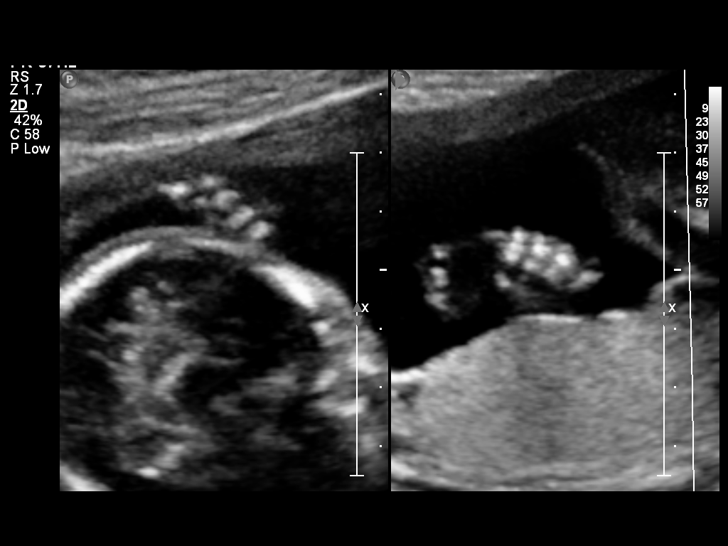
[im 67/96]
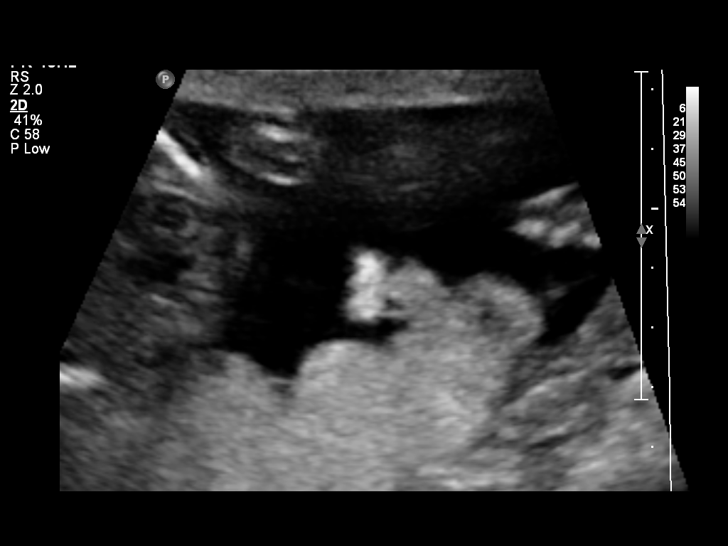
[im 78/96]
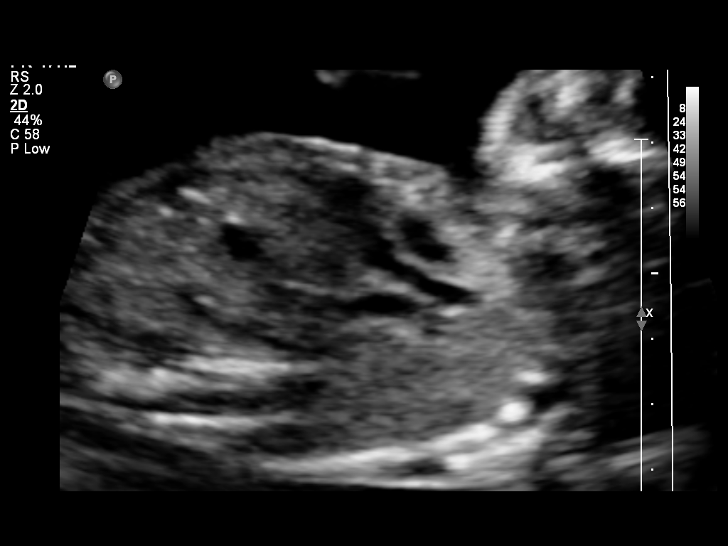
[im 85/96]
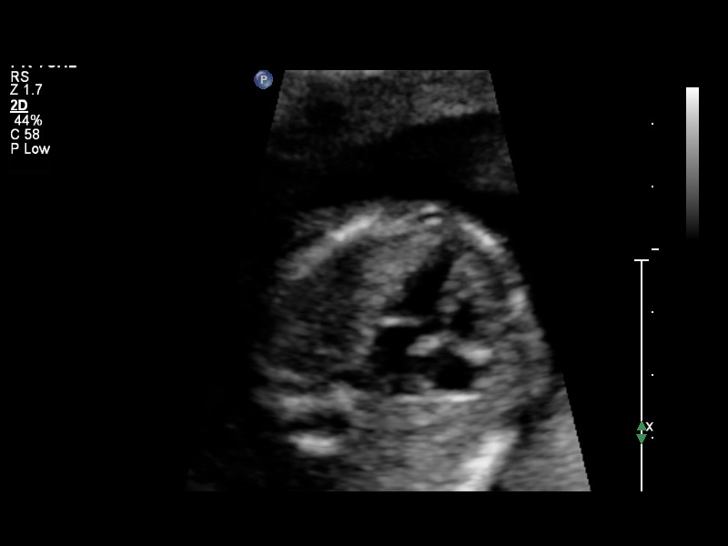
[im 92/96]
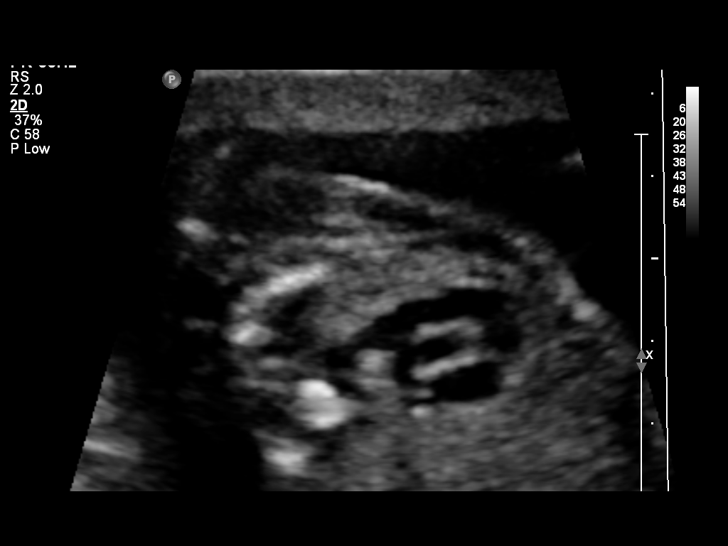

[12 of 28 positions shown; findings below may reference images not displayed]

OBSTETRICS REPORT
                      (Signed Final 10/20/2013 [DATE])

Service(s) Provided

 US OB COMP + 14 WK                                    76805.1
Indications

 Basic anatomic survey
 Previous cesarean section
Fetal Evaluation

 Num Of Fetuses:    1
 Fetal Heart Rate:  150                          bpm
 Cardiac Activity:  Observed
 Presentation:      Cephalic
 Placenta:          Posterior, above cervical
                    os
 P. Cord            Visualized, central
 Insertion:

 Amniotic Fluid
 AFI FV:      Subjectively within normal limits
                                             Larg Pckt:    4.88  cm
 RUQ:   4.88    cm
Biometry

 BPD:       51  mm     G. Age:  21w 4d                CI:        71.54   70 - 86
                                                      FL/HC:      17.6   16.8 -

 HC:       192  mm     G. Age:  21w 4d       91  %    HC/AC:      1.22   1.09 -

 AC:     157.8  mm     G. Age:  20w 6d       70  %    FL/BPD:
 FL:      33.7  mm     G. Age:  20w 4d       57  %    FL/AC:      21.4   20 - 24
 CER:     20.9  mm     G. Age:  19w 6d       45  %
 NFT:     4.52  mm

 Est. FW:     382  gm    0 lb 13 oz      58  %
Gestational Age

 LMP:           20w 1d        Date:  06/01/13                 EDD:   03/08/14
 U/S Today:     21w 1d                                        EDD:   03/01/14
 Best:          20w 1d     Det. By:  LMP  (06/01/13)          EDD:   03/08/14
Anatomy
 Cranium:          Appears normal         Aortic Arch:      Appears normal
 Fetal Cavum:      Appears normal         Ductal Arch:      Appears normal
 Ventricles:       Appears normal         Diaphragm:        Appears normal
 Choroid Plexus:   Appears normal         Stomach:          Appears normal, left
                                                            sided
 Cerebellum:       Appears normal         Abdomen:          Appears normal
 Posterior Fossa:  Appears normal         Abdominal Wall:   Appears nml (cord
                                                            insert, abd wall)
 Nuchal Fold:      Appears normal         Cord Vessels:     Appears normal (3
                                                            vessel cord)
 Face:             Appears normal         Kidneys:          Appear normal
                   (orbits and profile)
 Lips:             Appears normal         Bladder:          Appears normal
 Heart:            Appears normal         Spine:            Appears normal
                   (4CH, axis, and
                   situs)
 RVOT:             Appears normal         Lower             Appears normal
                                          Extremities:
 LVOT:             Appears normal         Upper             Appears normal
                                          Extremities:

 Other:  Male gender. Heels and 5th digit visualized. Nasal bone visualized.
Targeted Anatomy

 Fetal Central Nervous System
 Lat. Ventricles:  6.8                    Cisterna Magna:
Cervix Uterus Adnexa

 Cervical Length:    3.68     cm

 Cervix:       Normal appearance by transabdominal scan.
 Left Ovary:    Within normal limits.
 Right Ovary:   Within normal limits.
Comments

 The patient's fetal anatomic survey is now complete.  No fetal
 anomalies or soft markers of aneuploidy were seen.
Impression

 Single living intrauterine pregnancy at 20 weeks 1 day.
 Appropriate fetal growth (58%).
 Normal amniotic fluid volume.
 Normal fetal anatomy.
 No fetal anomalies or soft markers of aneuploidy seen.
Recommendations

 Follow-up ultrasounds as clinically indicated.

 questions or concerns.
                Simovic Jelic, Nikola Nora

## 2015-01-11 ENCOUNTER — Encounter (HOSPITAL_COMMUNITY): Payer: Self-pay | Admitting: Emergency Medicine

## 2015-01-11 ENCOUNTER — Emergency Department (HOSPITAL_COMMUNITY)
Admission: EM | Admit: 2015-01-11 | Discharge: 2015-01-11 | Disposition: A | Discharge status: Home / Self Care | Payer: Medicaid Other | Attending: Emergency Medicine | Admitting: Emergency Medicine

## 2015-01-11 DIAGNOSIS — M722 Plantar fascial fibromatosis: Secondary | ICD-10-CM | POA: Insufficient documentation

## 2015-01-11 DIAGNOSIS — M79671 Pain in right foot: Secondary | ICD-10-CM | POA: Insufficient documentation

## 2015-01-11 DIAGNOSIS — Z880 Allergy status to penicillin: Secondary | ICD-10-CM | POA: Insufficient documentation

## 2015-01-11 DIAGNOSIS — M79672 Pain in left foot: Secondary | ICD-10-CM

## 2015-01-11 NOTE — Discharge Instructions (Signed)
Use frozen water bottle to stretch and ice your foot each morning. Use tylenol or motrin as needed for pain. Follow up with the foot center listed above for ongoing management of your pain. Return to the ER for changes or worsening symptoms.   Plantar Fasciitis Plantar fasciitis is a painful foot condition that affects the heel. It occurs when the band of tissue that connects the toes to the heel bone (plantar fascia) becomes irritated. This can happen after exercising too much or doing other repetitive activities (overuse injury). The pain from plantar fasciitis can range from mild irritation to severe pain that makes it difficult for you to walk or move. The pain is usually worse in the morning or after you have been sitting or lying down for a while. CAUSES This condition may be caused by:  Standing for long periods of time.  Wearing shoes that do not fit.  Doing high-impact activities, including running, aerobics, and ballet.  Being overweight.  Having an abnormal way of walking (gait).  Having tight calf muscles.  Having high arches in your feet.  Starting a new athletic activity. SYMPTOMS The main symptom of this condition is heel pain. Other symptoms include:  Pain that gets worse after activity or exercise.  Pain that is worse in the morning or after resting.  Pain that goes away after you walk for a few minutes. DIAGNOSIS This condition may be diagnosed based on your signs and symptoms. Your health care provider will also do a physical exam to check for:  A tender area on the bottom of your foot.  A high arch in your foot.  Pain when you move your foot.  Difficulty moving your foot. You may also need to have imaging studies to confirm the diagnosis. These can include:  X-rays.  Ultrasound.  MRI. TREATMENT  Treatment for plantar fasciitis depends on the severity of the condition. Your treatment may include:  Rest, ice, and over-the-counter pain medicines to  manage your pain.  Exercises to stretch your calves and your plantar fascia.  A splint that holds your foot in a stretched, upward position while you sleep (night splint).  Physical therapy to relieve symptoms and prevent problems in the future.  Cortisone injections to relieve severe pain.  Extracorporeal shock wave therapy (ESWT) to stimulate damaged plantar fascia with electrical impulses. It is often used as a last resort before surgery.  Surgery, if other treatments have not worked after 12 months. HOME CARE INSTRUCTIONS  Take medicines only as directed by your health care provider.  Avoid activities that cause pain.  Roll the bottom of your foot over a bag of ice or a bottle of cold water. Do this for 20 minutes, 3-4 times a day.  Perform simple stretches as directed by your health care provider.  Try wearing athletic shoes with air-sole or gel-sole cushions or soft shoe inserts.  Wear a night splint while sleeping, if directed by your health care provider.  Keep all follow-up appointments with your health care provider. PREVENTION   Do not perform exercises or activities that cause heel pain.  Consider finding low-impact activities if you continue to have problems.  Lose weight if you need to. The best way to prevent plantar fasciitis is to avoid the activities that aggravate your plantar fascia. SEEK MEDICAL CARE IF:  Your symptoms do not go away after treatment with home care measures.  Your pain gets worse.  Your pain affects your ability to move or do your  daily activities.   This information is not intended to replace advice given to you by your health care provider. Make sure you discuss any questions you have with your health care provider.   Document Released: 12/05/2000 Document Revised: 12/01/2014 Document Reviewed: 01/20/2014 Elsevier Interactive Patient Education 2016 Elsevier Inc.  Cryotherapy Cryotherapy means treatment with cold. Ice or gel  packs can be used to reduce both pain and swelling. Ice is the most helpful within the first 24 to 48 hours after an injury or flare-up from overusing a muscle or joint. Sprains, strains, spasms, burning pain, shooting pain, and aches can all be eased with ice. Ice can also be used when recovering from surgery. Ice is effective, has very few side effects, and is safe for most people to use. PRECAUTIONS  Ice is not a safe treatment option for people with:  Raynaud phenomenon. This is a condition affecting small blood vessels in the extremities. Exposure to cold may cause your problems to return.  Cold hypersensitivity. There are many forms of cold hypersensitivity, including:  Cold urticaria. Red, itchy hives appear on the skin when the tissues begin to warm after being iced.  Cold erythema. This is a red, itchy rash caused by exposure to cold.  Cold hemoglobinuria. Red blood cells break down when the tissues begin to warm after being iced. The hemoglobin that carry oxygen are passed into the urine because they cannot combine with blood proteins fast enough.  Numbness or altered sensitivity in the area being iced. If you have any of the following conditions, do not use ice until you have discussed cryotherapy with your caregiver:  Heart conditions, such as arrhythmia, angina, or chronic heart disease.  High blood pressure.  Healing wounds or open skin in the area being iced.  Current infections.  Rheumatoid arthritis.  Poor circulation.  Diabetes. Ice slows the blood flow in the region it is applied. This is beneficial when trying to stop inflamed tissues from spreading irritating chemicals to surrounding tissues. However, if you expose your skin to cold temperatures for too long or without the proper protection, you can damage your skin or nerves. Watch for signs of skin damage due to cold. HOME CARE INSTRUCTIONS Follow these tips to use ice and cold packs safely.  Place a dry or  damp towel between the ice and skin. A damp towel will cool the skin more quickly, so you may need to shorten the time that the ice is used.  For a more rapid response, add gentle compression to the ice.  Ice for no more than 10 to 20 minutes at a time. The bonier the area you are icing, the less time it will take to get the benefits of ice.  Check your skin after 5 minutes to make sure there are no signs of a poor response to cold or skin damage.  Rest 20 minutes or more between uses.  Once your skin is numb, you can end your treatment. You can test numbness by very lightly touching your skin. The touch should be so light that you do not see the skin dimple from the pressure of your fingertip. When using ice, most people will feel these normal sensations in this order: cold, burning, aching, and numbness.  Do not use ice on someone who cannot communicate their responses to pain, such as small children or people with dementia. HOW TO MAKE AN ICE PACK Ice packs are the most common way to use ice therapy. Other methods  include ice massage, ice baths, and cryosprays. Muscle creams that cause a cold, tingly feeling do not offer the same benefits that ice offers and should not be used as a substitute unless recommended by your caregiver. To make an ice pack, do one of the following:  Place crushed ice or a bag of frozen vegetables in a sealable plastic bag. Squeeze out the excess air. Place this bag inside another plastic bag. Slide the bag into a pillowcase or place a damp towel between your skin and the bag.  Mix 3 parts water with 1 part rubbing alcohol. Freeze the mixture in a sealable plastic bag. When you remove the mixture from the freezer, it will be slushy. Squeeze out the excess air. Place this bag inside another plastic bag. Slide the bag into a pillowcase or place a damp towel between your skin and the bag. SEEK MEDICAL CARE IF:  You develop white spots on your skin. This may give the  skin a blotchy (mottled) appearance.  Your skin turns blue or pale.  Your skin becomes waxy or hard.  Your swelling gets worse. MAKE SURE YOU:   Understand these instructions.  Will watch your condition.  Will get help right away if you are not doing well or get worse.   This information is not intended to replace advice given to you by your health care provider. Make sure you discuss any questions you have with your health care provider.   Document Released: 11/06/2010 Document Revised: 04/02/2014 Document Reviewed: 11/06/2010 Elsevier Interactive Patient Education [employer]2016 Elsevier Inc.

## 2015-01-11 NOTE — ED Provider Notes (Signed)
CSN: [id number]     Arrival date & time 01/11/15  0940 History  By signing my name below, I, Elon SpannerGarrett Cook, attest that this documentation has been prepared under the direction and in the presence of [place] Stanger Camprubi-Sombs. Electronically Signed: Elon SpannerGarrett Cook ED Scribe. 01/11/2015. 9:56 AM.   No chief complaint on file.   Patient is a 29 y.o. female presenting with lower extremity pain. The history is provided by the patient. No language interpreter was used.  Foot Pain This is a new problem. The current episode started more than 1 week ago. The problem occurs constantly. The problem has not changed since onset.Pertinent negatives include no chest pain, no abdominal pain and no shortness of breath. The symptoms are aggravated by walking. Nothing relieves the symptoms. She has tried a cold compress for the symptoms. The treatment provided no relief.   HPI Comments: Doris Mays is a 29 y.o. female who presents to the Emergency Department complaining of constant, aching b/l foot/heel pain, 2/10 at rest, 7/10 with weight bearing, non-radiating, onset 2 weeks ago; worse with walking; unrelieved by soaking/elevation/ice.  Associated symptoms include percieved swelling in the heel.  She denies fever, chills, CP, SOB, abdominal pain, n/v/d/c, dysuria, hematuria, numbness, tingling, weakness, erythema, or warmth. No injuries.    No past medical history on file. Past Surgical History  Procedure Laterality Date  . Cesarean section     Family History  Problem Relation Age of Onset  . Diabetes Father   . Hypertension Other    Social History  Substance Use Topics  . Smoking status: Never Smoker   . Smokeless tobacco: Never Used  . Alcohol Use: No   OB History    Gravida Para Term Preterm AB TAB SAB Ectopic Multiple Living   2 2 2  0 0 0 0 0 0 2     Review of Systems  Constitutional: Negative for fever and chills.  Respiratory: Negative for shortness of breath.   Cardiovascular: Negative for  chest pain.  Gastrointestinal: Negative for nausea, vomiting, abdominal pain, diarrhea and constipation.  Genitourinary: Negative for dysuria and hematuria.  Musculoskeletal: Positive for arthralgias. Negative for myalgias.  Skin: Negative for color change.  Allergic/Immunologic: Negative for immunocompromised state.  Neurological: Negative for weakness and numbness.  Psychiatric/Behavioral: Negative for confusion.   10 Systems reviewed and all are negative for acute change except as noted in the HPI.  Allergies  Penicillins  Home Medications   Prior to Admission medications   Medication Sig Start Date End Date Taking? Authorizing Provider  ibuprofen (ADVIL,MOTRIN) 600 MG tablet Take 1 tablet (600 mg total) by mouth every 6 (six) hours. 03/12/14   Jacklyn ShellFrances Cresenzo-Dishmon, CNM  norethindrone (ORTHO MICRONOR) 0.35 MG tablet Take 1 tablet (0.35 mg total) by mouth daily. 03/12/14   Jacklyn ShellFrances Cresenzo-Dishmon, CNM   BP 116/76 mmHg  Pulse 66  Temp(Src) 97.8 F (36.6 C) (Oral)  Resp 16  SpO2 100%  Breastfeeding? Yes Physical Exam  Constitutional: She is oriented to person, place, and time. Vital signs are normal. She appears well-developed and well-nourished.  Non-toxic appearance. No distress.  Afebrile, nontoxic, NAD  HENT:  Head: Normocephalic and atraumatic.  Mouth/Throat: Mucous membranes are normal.  Eyes: Conjunctivae and EOM are normal. Right eye exhibits no discharge. Left eye exhibits no discharge.  Neck: Normal range of motion. Neck supple.  Cardiovascular: Normal rate and intact distal pulses.   Pulmonary/Chest: Effort normal. No respiratory distress.  Abdominal: Normal appearance. She exhibits no distension.  Musculoskeletal: Normal  range of motion.  Bilateral feet with FROM intact, mild TTP at the insertion of the plantar fascia at the heel, no forefoot or ankle TTP, no swelling or bruising, no erythema or warmth, no deformities.  Strength and sensation grossly intact.   Distal pulses intact.    Neurological: She is alert and oriented to person, place, and time. She has normal strength. No sensory deficit.  Skin: Skin is warm, dry and intact. No rash noted.  Psychiatric: She has a normal mood and affect. Her behavior is normal.  Nursing note and vitals reviewed.   ED Course  Procedures (including critical care time)  DIAGNOSTIC STUDIES: Oxygen Saturation is 100% on RA, normal by my interpretation.    COORDINATION OF CARE:  9:54 AM Patient advised to massage area of complaint with ice and use ibuprofen for pain.  Patient may use plantar fascitis brace.  Patient should f/u with podiatrist.  Patient acknowledges and agrees with plan.    Labs Review Labs Reviewed - No data to display  Imaging Review No results found.    EKG Interpretation None      MDM   Final diagnoses:  Plantar fasciitis, bilateral  Foot pain, bilateral    29 y.o. female here with foot/heel pain x2 wks. Likely plantar fasciitis based on exam and symptoms. NVI with soft compartments. Doubt need for imaging. Discussed ice bottle stretching, and tylenol/motrin. F/up with foot center in 1-2wks. I explained the diagnosis and have given explicit precautions to return to the ER including for any other new or worsening symptoms. The patient understands and accepts the medical plan as it's been dictated and I have answered their questions. Discharge instructions concerning home care and prescriptions have been given. The patient is STABLE and is discharged to home in good condition.   I personally performed the services described in this documentation, which was scribed in my presence. The recorded information has been reviewed and is accurate.  BP 116/76 mmHg  Pulse 66  Temp(Src) 97.8 F (36.6 C) (Oral)  Resp 16  SpO2 100%  Breastfeeding? Yes  No orders of the defined types were placed in this encounter.      France Ravens Camprubi-Soms, PA-C 01/11/15 1011  Leta Baptist,  MD 01/11/15 2142

## 2015-01-11 NOTE — ED Notes (Signed)
C/o bilateral foot pain x 1 week. Hx of pain on and off in the past.

## 2015-03-27 NOTE — L&D Delivery Note (Signed)
Patient complete and pushing. SVD of viable female infant over intact perineum. Nuchal cord x1, easily reduced at mother's abdomen . Infant delivered to mom's abdomen. Delayed cord clamping x 1 minute. Cord clamped x 2, cut. Spontaneous cry heard.   Cord blood obtained. Placenta delivered spontaneously and intact. LUS cleared of clot. Fundus firm on exam, pitocin running.  Lacerations: Pending Suture: EBL: 100 cc Anesthesia: epidural, local  Apgars: Pending Weight: pending, skin to skin  Instrument and sponge count x2 correct.   [name],[name]

## 2015-12-21 LAB — OB RESULTS CONSOLE GC/CHLAMYDIA: CHLAMYDIA, DNA PROBE: NEGATIVE

## 2015-12-21 LAB — OB RESULTS CONSOLE HIV ANTIBODY (ROUTINE TESTING): HIV: NONREACTIVE

## 2015-12-21 LAB — OB RESULTS CONSOLE GBS: GBS: NEGATIVE

## 2016-02-04 ENCOUNTER — Inpatient Hospital Stay (HOSPITAL_COMMUNITY)
Admission: AD | Admit: 2016-02-04 | Discharge: 2016-02-06 | DRG: 775 | Disposition: A | Discharge status: Home / Self Care | Payer: Medicaid Other | Source: Ambulatory Visit | Attending: Obstetrics & Gynecology | Admitting: Obstetrics & Gynecology

## 2016-02-04 ENCOUNTER — Encounter (HOSPITAL_COMMUNITY): Payer: Self-pay

## 2016-02-04 DIAGNOSIS — Z8249 Family history of ischemic heart disease and other diseases of the circulatory system: Secondary | ICD-10-CM | POA: Diagnosis not present

## 2016-02-04 DIAGNOSIS — Z3A40 40 weeks gestation of pregnancy: Secondary | ICD-10-CM | POA: Diagnosis not present

## 2016-02-04 DIAGNOSIS — Z3403 Encounter for supervision of normal first pregnancy, third trimester: Secondary | ICD-10-CM | POA: Diagnosis present

## 2016-02-04 DIAGNOSIS — O479 False labor, unspecified: Secondary | ICD-10-CM | POA: Diagnosis present

## 2016-02-04 DIAGNOSIS — O34211 Maternal care for low transverse scar from previous cesarean delivery: Secondary | ICD-10-CM | POA: Diagnosis present

## 2016-02-04 DIAGNOSIS — Z833 Family history of diabetes mellitus: Secondary | ICD-10-CM | POA: Diagnosis not present

## 2016-02-04 LAB — TYPE AND SCREEN
ABO/RH(D): O POS
Antibody Screen: NEGATIVE

## 2016-02-04 LAB — CBC
HEMATOCRIT: 35.8 % — AB (ref 36.0–46.0)
HEMOGLOBIN: 11.9 g/dL — AB (ref 12.0–15.0)
MCH: 28.1 pg (ref 26.0–34.0)
MCHC: 33.2 g/dL (ref 30.0–36.0)
MCV: 84.6 fL (ref 78.0–100.0)
Platelets: 245 10*3/uL (ref 150–400)
RBC: 4.23 MIL/uL (ref 3.87–5.11)
RDW: 14.8 % (ref 11.5–15.5)
WBC: 10.8 10*3/uL — ABNORMAL HIGH (ref 4.0–10.5)

## 2016-02-04 LAB — RPR: RPR: NONREACTIVE

## 2016-02-04 MED ORDER — ONDANSETRON HCL 4 MG PO TABS: 4.0000 mg | ORAL_TABLET | ORAL | Status: DC | PRN

## 2016-02-04 MED ORDER — FENTANYL CITRATE (PF) 100 MCG/2ML IJ SOLN
INTRAMUSCULAR | Status: AC
  Filled 2016-02-04: qty 2

## 2016-02-04 MED ORDER — DIBUCAINE 1 % RE OINT: 1.0000 "application " | TOPICAL_OINTMENT | RECTAL | Status: DC | PRN

## 2016-02-04 MED ORDER — IBUPROFEN 600 MG PO TABS
600.0000 mg | ORAL_TABLET | Freq: Four times a day (QID) | ORAL | Status: DC
  Administered 2016-02-04 – 2016-02-06 (×8): 600 mg via ORAL
  Filled 2016-02-04 (×8): qty 1

## 2016-02-04 MED ORDER — OXYCODONE-ACETAMINOPHEN 5-325 MG PO TABS: 2.0000 | ORAL_TABLET | ORAL | Status: DC | PRN

## 2016-02-04 MED ORDER — ONDANSETRON HCL 4 MG/2ML IJ SOLN: 4.0000 mg | Freq: Four times a day (QID) | INTRAMUSCULAR | Status: DC | PRN

## 2016-02-04 MED ORDER — PRENATAL MULTIVITAMIN CH
1.0000 | ORAL_TABLET | Freq: Every day | ORAL | Status: DC
  Administered 2016-02-04 – 2016-02-05 (×2): 1 via ORAL
  Filled 2016-02-04 (×2): qty 1

## 2016-02-04 MED ORDER — OXYTOCIN 40 UNITS IN LACTATED RINGERS INFUSION - SIMPLE MED
2.5000 [IU]/h | INTRAVENOUS | Status: DC
Start: 2016-02-04 — End: 2016-02-04

## 2016-02-04 MED ORDER — OXYTOCIN 40 UNITS IN LACTATED RINGERS INFUSION - SIMPLE MED
INTRAVENOUS | Status: AC
  Filled 2016-02-04: qty 1000

## 2016-02-04 MED ORDER — LIDOCAINE HCL (PF) 1 % IJ SOLN
INTRAMUSCULAR | Status: DC
Start: 2016-02-04 — End: 2016-02-04
  Filled 2016-02-04: qty 30

## 2016-02-04 MED ORDER — COCONUT OIL OIL: 1.0000 "application " | TOPICAL_OIL | Status: DC | PRN

## 2016-02-04 MED ORDER — ACETAMINOPHEN 325 MG PO TABS: 650.0000 mg | ORAL_TABLET | ORAL | Status: DC | PRN

## 2016-02-04 MED ORDER — SIMETHICONE 80 MG PO CHEW
80.0000 mg | CHEWABLE_TABLET | ORAL | Status: DC | PRN
Start: 2016-02-04 — End: 2016-02-06

## 2016-02-04 MED ORDER — OXYCODONE-ACETAMINOPHEN 5-325 MG PO TABS: 1.0000 | ORAL_TABLET | ORAL | Status: DC | PRN

## 2016-02-04 MED ORDER — OXYTOCIN BOLUS FROM INFUSION: 500.0000 mL | Freq: Once | INTRAVENOUS | Status: DC

## 2016-02-04 MED ORDER — LIDOCAINE HCL (PF) 1 % IJ SOLN
30.0000 mL | INTRAMUSCULAR | Status: DC | PRN
Start: 2016-02-04 — End: 2016-02-04
  Filled 2016-02-04: qty 30

## 2016-02-04 MED ORDER — FLEET ENEMA 7-19 GM/118ML RE ENEM
1.0000 | ENEMA | RECTAL | Status: DC | PRN
Start: 2016-02-04 — End: 2016-02-04

## 2016-02-04 MED ORDER — LACTATED RINGERS IV SOLN: 500.0000 mL | INTRAVENOUS | Status: DC | PRN

## 2016-02-04 MED ORDER — LACTATED RINGERS IV SOLN: INTRAVENOUS | Status: DC

## 2016-02-04 MED ORDER — ONDANSETRON HCL 4 MG/2ML IJ SOLN: 4.0000 mg | INTRAMUSCULAR | Status: DC | PRN

## 2016-02-04 MED ORDER — SOD CITRATE-CITRIC ACID 500-334 MG/5ML PO SOLN: 30.0000 mL | ORAL | Status: DC | PRN

## 2016-02-04 MED ORDER — WITCH HAZEL-GLYCERIN EX PADS: 1.0000 "application " | MEDICATED_PAD | CUTANEOUS | Status: DC | PRN

## 2016-02-04 MED ORDER — SENNOSIDES-DOCUSATE SODIUM 8.6-50 MG PO TABS
2.0000 | ORAL_TABLET | ORAL | Status: DC
  Administered 2016-02-05 (×2): 2 via ORAL
  Filled 2016-02-04 (×2): qty 2

## 2016-02-04 MED ORDER — DIPHENHYDRAMINE HCL 25 MG PO CAPS: 25.0000 mg | ORAL_CAPSULE | Freq: Four times a day (QID) | ORAL | Status: DC | PRN

## 2016-02-04 MED ORDER — FENTANYL CITRATE (PF) 100 MCG/2ML IJ SOLN: 50.0000 ug | INTRAMUSCULAR | Status: DC | PRN

## 2016-02-04 MED ORDER — BENZOCAINE-MENTHOL 20-0.5 % EX AERO: 1.0000 "application " | INHALATION_SPRAY | CUTANEOUS | Status: DC | PRN

## 2016-02-04 MED ORDER — TETANUS-DIPHTH-ACELL PERTUSSIS 5-2.5-18.5 LF-MCG/0.5 IM SUSP: 0.5000 mL | Freq: Once | INTRAMUSCULAR | Status: DC

## 2016-02-04 MED ORDER — ZOLPIDEM TARTRATE 5 MG PO TABS: 5.0000 mg | ORAL_TABLET | Freq: Every evening | ORAL | Status: DC | PRN

## 2016-02-04 NOTE — H&P (Signed)
LABOR AND DELIVERY ADMISSION HISTORY AND PHYSICAL NOTE  Doris Mays is a 30 y.o. female 563P2002 with IUP at 46100w3d by US presenting for SOL.   She reports positive fetal movement. She denies leakage of fluid or vaginal bleeding.  Prenatal History/Complications:  Past Medical History: History reviewed. No pertinent past medical history.  Past Surgical History: Past Surgical History:  Procedure Laterality Date  . CESAREAN SECTION      Obstetrical History: OB History    Gravida Para Term Preterm AB Living   3 2 2  0 0 2   SAB TAB Ectopic Multiple Live Births   0 0 0 0 2      Social History: Social History   Social History  . Marital status: Married    Spouse name: N/A  . Number of children: N/A  . Years of education: N/A   Social History Main Topics  . Smoking status: Never Smoker  . Smokeless tobacco: Never Used  . Alcohol use No  . Drug use: No  . Sexual activity: Yes    Birth control/ protection: None   Other Topics Concern  . None   Social History Narrative  . None    Family History: Family History  Problem Relation Age of Onset  . Diabetes Father   . Hypertension Other     Allergies: Allergies  Allergen Reactions  . Penicillins Hives    Prescriptions Prior to Admission  Medication Sig Dispense Refill Last Dose  . ibuprofen (ADVIL,MOTRIN) 600 MG tablet Take 1 tablet (600 mg total) by mouth every 6 (six) hours. 30 tablet 0 Taking  . norethindrone (ORTHO MICRONOR) 0.35 MG tablet Take 1 tablet (0.35 mg total) by mouth daily. 1 Package 11 Taking     Review of Systems   All systems reviewed and negative except as stated in HPI  currently breastfeeding. General appearance: alert, cooperative, appears stated age and moderate distress Heart: regular rate and rhythm with intact distal pulses Extremities: No calf swelling or tenderness Fetal monitoring: Category 1 Uterine activity: Q 2-5 minutes Dilation: 7 Effacement (%): 70   Prenatal  labs: ABO, Rh:   Antibody:   Rubella: !Error! RPR:    HBsAg:    HIV:    GBS:    1 hr Glucola: 98 Genetic screening:  Declined Anatomy US: Normal Female  Prenatal Transfer Tool  Maternal Diabetes: No Genetic Screening: Normal Maternal Ultrasounds/Referrals: Normal Fetal Ultrasounds or other Referrals:  None Maternal Substance Abuse:  No Significant Maternal Medications:  Not dicussed Significant Maternal Lab Results: None  No results found for this or any previous visit (from the past 24 hour(s)).  Patient Active Problem List   Diagnosis Date Noted  . Active labor 03/10/2014  . NSVD (normal spontaneous vaginal delivery) 03/10/2014  . Supervision of normal pregnancy in second trimester 10/14/2013  . Previous cesarean delivery affecting pregnancy, antepartum 08/05/2013    Assessment: Doris Finesova Chalmers is a 30 y.o. G3P2002 at 43100w3d here for SOL  #Labor:Will admit and monitor for natural progression of labor #Pain: IV Fentanyl #FWB: Category 1 #ID:  GBS Negative #MOF: Breast #MOC:Undecided #Circ:  No  Josue D Santos 02/04/2016, 7:05 AM   I have seen the above pt and agree with the above.  [name],Andren Bethea

## 2016-02-04 NOTE — Plan of Care (Signed)
Problem: Nutritional: Goal: Mothers verbalization of comfort with breastfeeding process will improve Outcome: Completed/Met Date Met: 02/04/16 Encouraged patient to call when breast feeding for latch assessment and/or assist

## 2016-02-04 NOTE — Progress Notes (Signed)
G3P2 with successful VBAC@ 40+[redacted] wksga.Presents to L&D for contractions. SVE 7-8 Bb. MD notified.

## 2016-02-04 NOTE — Lactation Note (Addendum)
This note was copied from a baby's chart. Lactation Consultation Note  Patient Name: Boy Eual Finesova Salvaggio ZOXWR'UToday's Date: 02/04/2016 Reason for consult: Initial assessment   Initial assessment with Exp BF mom of 10 hour old infant. Infant with 5 BF for 10-20 minutes, 1 void and 2 stools since birth. Infant weight 8 lb 2.2 oz. LATCH Scores 9 by bedside RN. Mom reports BF her 29 yo for a few days and felt the milk did not come in fast enough, she BF her 29 yo for 15 months. Infant currently asleep in crib and mom is eating dinner.   Mom reports she feels BF is going well. Mom reports some nipple tenderness with latch, enc her to hand express and apply EBM to nipples post BF. Mom reports she has been shown how to hand express. Enc mom to feed infant 8-12 x in 24 hours at first feeding cues. Enc mom to call out to desk for feeding assistance prn. Enc mom to feed infant 8-12 x in 24 hours at first feeding cues.   BF Resources Handout and LC Brochure given, mom informed of IP/OP services, BF Support Groups and LC phone #. Mom denies questions/concerns at this time.  Follow up tomorrow and prn.      Maternal Data Formula Feeding for Exclusion: No Has patient been taught Hand Expression?: Yes Does the patient have breastfeeding experience prior to this delivery?: Yes  Feeding    LATCH Score/Interventions                      Lactation Tools Discussed/Used WIC Program: Yes   Consult Status Consult Status: Follow-up Date: 02/05/16 Follow-up type: In-patient    Silas FloodSharon S Morayo Leven 02/04/2016, 5:17 PM

## 2016-02-04 NOTE — MAU Note (Signed)
[place] - [facility] Don[facility] - will not do VBAC there so coming here, supposed to transfer care here.  Contractions started at 0130, was 5 min apart at 6 am.  No bleeding. Baby moving well. No leaking.

## 2016-02-04 NOTE — Progress Notes (Signed)
To BS via stretcher 

## 2016-02-05 NOTE — Lactation Note (Signed)
This note was copied from a baby's chart. Lactation Consultation Note; follow up consult , Mother was sleeping soundly with infant in bed with her. I offered to place infant in crib and mother declined. Mother denies having any questions or concerns.  Patient Name: Dorna BloomBoy Havanah Kazmi AOZHY'QToday's Date: 02/05/2016 Reason for consult: Follow-up assessment   Maternal Data    Feeding Feeding Type: Breast Fed Length of feed: 20 min  LATCH Score/Interventions Latch: Grasps breast easily, tongue down, lips flanged, rhythmical sucking.  Audible Swallowing: A few with stimulation  Type of Nipple: Everted at rest and after stimulation  Comfort (Breast/Nipple): Soft / non-tender     Hold (Positioning): No assistance needed to correctly position infant at breast.  LATCH Score: 9  Lactation Tools [facility]iscussed/[facility]   Consult Status      Stevan BornKendrick, Ajene Carchi [facility] 02/05/2016, 2:45 PM

## 2016-02-05 NOTE — Clinical Social Work Maternal (Signed)
  CLINICAL SOCIAL WORK MATERNAL/CHILD NOTE  Patient Details  Name: Doris Mays MRN: [id number] Date of Birth: 08-07-1985  Date:  02/05/2016  Clinical Social Worker Initiating Note:  Ferdinand Lango Shun Pletz, MSW, LCSW-A  Date/ Time Initiated:  02/05/16/1122     Child's Name:  Doris Mays    Legal Guardian:  Other (Comment) (Not established by court system; [name] and FOB(husband) parent collectively )   Need for Interpreter:  None   Date of Referral:  02/04/16     Reason for Referral:  Late or No Prenatal Care    Referral Source:  Physician   Address:  [address], [place] [address]  Phone number:  [phone]   Household Members:  Self, Spouse, Minor Children   Natural Supports (not living in the home):  Immediate Family, Extended Family   Professional Supports: None   Employment: Unemployed   Type of Work: Unemployed    Education:  9 to 11 years   Financial Resources:  Medicaid   Other Resources:  Physicist, medical , [place] Considerations Which May Impact Care:  None reported at this time.   Strengths:  Ability to meet basic needs , Compliance with medical plan , Home prepared for child , Pediatrician chosen  ([facility] )   Risk Factors/Current Problems:  None   Cognitive State:  Alert , Able to Concentrate , Insightful    Mood/Affect:  Calm , Comfortable , Relaxed    CSW Assessment: CSW met with [name] at bedside to complete assessment. At the time of this writer's arrival, [name] was resting in bed with baby asleep. This Probation officer explained role and reasoning for visit being due to [name] receiving [facility] @ 30 weeks. This Probation officer informed [name] of the hospital protocol regarding [facility] and the performance of UDS and cord blood testing. [name] verbalized understanding and noted she was unaware she was pregnant until she was about 30 weeks due to still currently breast feeding her other child. This Probation officer informed [name] that baby's UDS was negative and that cord blood  test is still pending. [name] verbalized understanding noting that she did not use substance anytime during her pregnancy.   CSW assessed whether [name] had any psychosocial needs at this time. [name] stated she had no further needs. CSW has no concerns and/or barriers to d/c thus case closed to this Probation officer.   CSW Plan/Description:  No Further Intervention Required/No Barriers to Discharge   Oda Cogan, MSW, [facility]  Office: [phone]

## 2016-02-05 NOTE — Progress Notes (Signed)
Post Partum Day 1 Subjective: no complaints, up ad lib, voiding and tolerating PO  Objective: Blood pressure 115/65, pulse 81, temperature 98.2 F (36.8 C), temperature source Oral, resp. rate 18, height 5\' 4"  (1.626 m), weight 214 lb (97.1 kg), SpO2 100 %, currently breastfeeding.  Physical Exam:  General: alert, cooperative, appears stated age and no distress Lochia: appropriate Uterine Fundus: firm Incision: n/a DVT Evaluation: No evidence of DVT seen on physical exam.   Recent Labs  02/04/16 0700  HGB 11.9*  HCT 35.8*    Assessment/Plan: Plan for discharge tomorrow   LOS: 1 day   Wyvonnia DuskyMarie Lawson 02/05/2016, 9:07 AM

## 2016-02-06 ENCOUNTER — Encounter (HOSPITAL_COMMUNITY): Payer: Self-pay | Admitting: *Deleted

## 2016-02-06 MED ORDER — IBUPROFEN 600 MG PO TABS: 600.0000 mg | ORAL_TABLET | Freq: Four times a day (QID) | ORAL | 0 refills | Status: AC

## 2016-02-06 NOTE — Lactation Note (Signed)
This note was copied from a baby's chart. Lactation Consultation Note  Mother recently breastfed baby for 15 min.  Denies soreness. Baby sleeping beside mother.  Asked question about if baby is getting enough. Discussed chart in baby and me booklet and milk transitioning. Reviewed engorgement care and monitoring voids/stools. Mom encouraged to feed baby 8-12 times/24 hours and with feeding cues.  Suggest she call if she gets home and has further concerns.  Patient Name: Doris Mays ZOXWR'UToday's Date: 02/06/2016     Maternal Data    Feeding Feeding Type: Breast Fed Length of feed: 15 min  LATCH Score/Interventions Latch: Grasps breast easily, tongue down, lips flanged, rhythmical sucking.  Audible Swallowing: A few with stimulation  Type of Nipple: Everted at rest and after stimulation  Comfort (Breast/Nipple): Soft / non-tender     Hold (Positioning): No assistance needed to correctly position infant at breast.  LATCH Score: 9  Lactation Tools Discussed/Used     Consult Status      Dahlia ByesBerkelhammer, Leocadia Idleman [facility] 02/06/2016, 8:49 AM

## 2016-02-06 NOTE — Discharge Summary (Signed)
OB Discharge Summary  Patient Name: Doris Mays DOB: Oct 08, 1985 MRN: [id number]  Date of admission: 02/04/2016 Delivering MD: Jacklyn ShellRESENZO-DISHMON, FRANCES   Date of discharge: 02/06/2016  Admitting diagnosis: 40Wks Labor Intrauterine pregnancy: 6753w5d     Secondary diagnosis:Active Problems:   Uterine contractions during pregnancy  Additional problems:none     Discharge diagnosis: Term Pregnancy Delivered                                                                     Post partum procedures:n/a  Augmentation: n/a  Complications: None  Hospital course:  Onset of Labor With Vaginal Delivery     30 y.o. yo G3P2002 at 4053w5d was admitted in Active Labor on 02/04/2016. Patient had an uncomplicated labor course as follows:  Membrane Rupture Time/Date: 6:55 AM ,02/04/2016   Intrapartum Procedures: Episiotomy: None [1]                                         Lacerations:  None [1]  Patient had a delivery of a Viable infant. 02/04/2016  Information for the patient's newborn:  Dorna BloomFuquay, Boy Indica [[id number]][[id number]]  Delivery Method: VBAC, Spontaneous (Filed from Delivery Summary)    Pateint had an uncomplicated postpartum course.  She is ambulating, tolerating a regular diet, passing flatus, and urinating well. Patient is discharged home in stable condition on 02/06/16.    Physical exam Vitals:   02/04/16 2250 02/05/16 0601 02/05/16 1900 02/06/16 0557  BP: 122/82 115/65 115/62 96/60  Pulse: 75 81 83 69  Resp: 18 18 18 16   Temp: 98.3 F (36.8 C) 98.2 F (36.8 C) 99.1 F (37.3 C) 98.1 F (36.7 C)  TempSrc: Oral Oral Oral Oral  SpO2:   99%   Weight:      Height:       General: alert, cooperative and no distress Lochia: appropriate Uterine Fundus: firm Incision: N/A DVT Evaluation: No evidence of DVT seen on physical exam. Labs: Lab Results  Component Value Date   WBC 10.8 (H) 02/04/2016   HGB 11.9 (L) 02/04/2016   HCT 35.8 (L) 02/04/2016   MCV 84.6 02/04/2016   PLT  245 02/04/2016   CMP Latest Ref Rng & Units 12/10/2013  Glucose 70 - 99 mg/dL 89  BUN 6 - 23 mg/dL 7  Creatinine 9.620.50 - 9.521.10 mg/dL 8.41(L0.46(L)  Sodium 244137 - 010147 mEq/L 137  Potassium 3.7 - 5.3 mEq/L 3.7  Chloride 96 - 112 mEq/L 104  CO2 19 - 32 mEq/L 23  Calcium 8.4 - 10.5 mg/dL 9.1    Discharge instruction: per After Visit Summary and "Baby and Me Booklet".  After Visit Meds:    Medication List    TAKE these medications   ibuprofen 600 MG tablet Commonly known as:  ADVIL,MOTRIN Take 1 tablet (600 mg total) by mouth every 6 (six) hours.       Diet: routine diet  Activity: Advance as tolerated. Pelvic rest for 6 weeks.   Outpatient follow up:6 weeks Follow up Appt:No future appointments. Follow up visit: No Follow-up on file.  Postpartum contraception: Undecided  Newborn Data: Live born female  Birth Weight:  8 lb 2.2 oz (3691 g) APGAR: 7, 9  Baby Feeding: Bottle Disposition:home with mother   02/06/2016 Wyvonnia DuskyMarie Chong Wojdyla, CNM
